# Patient Record
Sex: Male | Born: 1970 | Race: White | Hispanic: No | Marital: Single | Smoking: Current every day smoker
Health system: Southern US, Community
[De-identification: ages and names within clinical notes are randomized; demographics above are authoritative.]

## PROBLEM LIST (undated history)

## (undated) DIAGNOSIS — N289 Disorder of kidney and ureter, unspecified: Secondary | ICD-10-CM

## (undated) DIAGNOSIS — J449 Chronic obstructive pulmonary disease, unspecified: Secondary | ICD-10-CM

---

## 2012-11-11 LAB — URINALYSIS, COMPLETE
Bacteria: NONE SEEN
Glucose,UR: NEGATIVE mg/dL (ref 0–75)
Ketone: NEGATIVE
Protein: NEGATIVE
RBC,UR: 2 /HPF (ref 0–5)
Specific Gravity: 1.008 (ref 1.003–1.030)
Squamous Epithelial: <1
WBC UR: 2 /HPF (ref 0–5)

## 2012-11-11 LAB — COMPREHENSIVE METABOLIC PANEL
Anion Gap: 12 (ref 7–16)
BUN: 7 mg/dL (ref 7–18)
Bilirubin,Total: 0.3 mg/dL (ref 0.2–1.0)
Chloride: 105 mmol/L (ref 98–107)
Co2: 24 mmol/L (ref 21–32)
Creatinine: 0.64 mg/dL (ref 0.60–1.30)
EGFR (African American): >60
Potassium: 3.3 mmol/L — ABNORMAL LOW (ref 3.5–5.1)
SGOT(AST): 26 U/L (ref 15–37)
SGPT (ALT): 27 U/L (ref 12–78)
Sodium: 141 mmol/L (ref 136–145)
Total Protein: 7.4 g/dL (ref 6.4–8.2)

## 2012-11-11 LAB — SALICYLATE LEVEL: Salicylates, Serum: 2.7 mg/dL

## 2012-11-11 LAB — CBC
HGB: 16.2 g/dL (ref 13.0–18.0)
Platelet: 323 10*3/uL (ref 150–440)
RBC: 5.21 10*6/uL (ref 4.40–5.90)

## 2012-11-11 LAB — ACETAMINOPHEN LEVEL: Acetaminophen: <2 ug/mL

## 2012-11-11 LAB — ETHANOL
Ethanol %: 0.268 % — ABNORMAL HIGH (ref 0.000–0.080)
Ethanol: 268 mg/dL

## 2012-11-11 LAB — TSH: Thyroid Stimulating Horm: 0.7 u[IU]/mL

## 2012-11-11 LAB — DRUG SCREEN, URINE
Amphetamines, Ur Screen: NEGATIVE (ref ?–1000)
Barbiturates, Ur Screen: NEGATIVE (ref ?–200)
Benzodiazepine, Ur Scrn: NEGATIVE (ref ?–200)
Methadone, Ur Screen: NEGATIVE (ref ?–300)
Phencyclidine (PCP) Ur S: NEGATIVE (ref ?–25)
Tricyclic, Ur Screen: NEGATIVE (ref ?–1000)

## 2012-11-12 ENCOUNTER — Inpatient Hospital Stay: Payer: Self-pay | Admitting: Psychiatry

## 2015-02-15 NOTE — H&P (Signed)
PATIENT NAME:  Lawrence Hanna, Taggart MR#:  [id number] DATE OF BIRTH:  January 16, 1971  DATE OF ADMISSION:  11/12/2012  PLACE OF DICTATION:  [facility], [place].   SEX:  Male.  RACE:  White.  AGE:  44 years.  INITIAL ASSESSMENT:  Psychiatric evaluation.   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  The patient is never married, single and has been living with a roommate in an apartment.  The patient comes for inpatient hospitalization  at [facility]with  CHIEF COMPLAINT:  "Being very depressed, having suicidal thoughts and drinking too much alcohol and need help."  HISTORY OF PRESENT ILLNESS:  When patient was asked when he last felt well he reported "quite some time ago."  The patient started feeling depressed because he is overburdened with lots of stressors of life and this includes no family and nobody could care, "do not feel I am wanted by anybody." Started drinking alcohol at a rate of 5 or 6 beers per day and the last drink was just prior to his admission, decided that he needed help.  Started having suicidal thoughts and so he decided he should get help.    PAST PSYCHIATRIC HISTORY:  This is his third inpatient hospitalization to psychiatry. .  First inpatient of psychiatry was around 2000 at [facility] for 7 days for depression.  Longest period of inpatient with psychiatry was for 7 days.  History of suicide attempt twice, each time he slashed his wrists.  Not being followed by any psychiatrist at this time.  Family history of mental illness None known for MI..  No known suicides in the family.    FAMILY HISTORY:  .  Father is a Naval architecttruck driver.  Father is living and 45 years old.  Mother worked in a factory.  Mother is living and 45 years old.  Has 1 brother, not close to family.    PERSONAL HISTORY:  [place]  Dropped out of school because of being with the wrong crowd because he started doing drugs, smoking marijuana.  Got GED later.  No college.   WORK HISTORY:  First job was at age 44 years at [employer] and he did odd jobs for [employer].  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  This job lasted for 4 years and quit because company went out of business.  Last worked in October 2013 and job was only seasonal.    MILITARY HISTORY:  None.  MARRIAGES:  Never married.  Has 2 children, they are 45 year old twins, not in touch with them.  Used to pay child support, but not anymore.    HABITS:  First drink of alcohol was at age 44 years.  It became a problem recently when he started drinking at the rate of 5 beers per day or 6 beers per day because of feeling depressed and feeling hopeless and helpless.  Did try marijuana and crack cocaine but not anymore.  Denies IV drugs.  Does admit smoking nicotine cigarettes at a rate of 1-1/2 packs a day for many years.    PAST MEDICAL HISTORY:  No high blood pressure.  No diabetes mellitus.  No major surgeries.  No major injuries.  No history of motor vehicle accident  and never been unconscious.  He has kidney stones and being followed by no doctors at this time.  Was treated at [facility] 2 weeks ago for kidney stones, but no follow because he has no insurance.   ALLERGIES:  No known drug allergies.   PRIMARY CARE PHYSICIAN:  Not being followed by any physician as he cannot afford it.   PHYSICAL EXAMINATION:  VITAL SIGNS:  Temperature 97.6, pulse is 70 per minute regular, respiratory rate is 18 per minute, blood pressure is 118/76 mmHg.  HEENT:  Head is normocephalic, atraumatic.  Eyes PERRLA.  Fundi bilaterally benign.  Tympanic membranes visualized, no exudates.  NECK:  Supple without any organomegaly, lymphadenopathy.  CHEST:  Normal expansion.   Normal breath sounds. HEART:  Normal S1 without any murmurs or gallops. ABDOMEN:  Soft.  No organomegaly.  Bowel sounds heard.  RECTAL:  Deferred.  NEUROLOGIC:  Gait is normal.  Romberg is Negative..  Cranial nerves II-XII grossly intact.  DTRs 2+.  and normal..  Plantars are normal.    MENTAL STATUS EXAMINATION:  The patient is dressed in street clothes, alert and oriented to place, person, time.  and date and resson for admission to [facility].  Affect is appropriate.  with  mood which is lower, down and depressed, admits feeling low and down and depressed about his situation, life in general, finances and not having a family and not having anybody that cares for him.  Admits feeling hopeless, and helpless. and feels  worthless and useless..  Does have suicidal wishes.  Has no plans to hurt himself and wants to get help for the same.  No psychosis.  Denies auditory or visual hallucinations, paranoid thinking.  Cognition is intact.  He knew, name of the current president and previous president.  He could spell the word world forward and backward without any problem.  Memory and recall are good.  He could count money.  He reports that he has wishes of death, but contracts for safety and is not going to do anything to hurt himself at this time.  Insight and judgment are guarded.   IMPRESSION:  AXIS I:  Major depressive disorder, recurrent with the suicidal ideas, nicotine dependence, alcohol abuse/dependence, history of THC and cocaine abuse, remote, in remission.  AXIS II:  Deferred.  AXIS III:  Kidney stones and was treated for the same. AXIS IV:  Severe, loneliness, financial, no family support.  AXIS V:  Global assessment of functioning 25.   PLAN:  The patient will be admitted to [facility] for a closer observation eval and help.Marland Kitchen  He will be started on antidepressant medication to help him with his depression and cope with his depression.  During the stay in the hospital he will be given  mileau therapy.  He will take part in individual, group therapy where coping skills and dealing with loneliness will be addressed.  At the time of discharge the patient will not be depressed and have enough coping skills and appropriate followup appointment will be made in the community.     ____________________________ Jannet Mantis. Guss Bunde, MD skc:ea D: 11/12/2012 18:35:47 ET T: 11/13/2012 06:22:47 ET JOB#: [id number]  cc: Monika Salk K. Guss Bunde, MD, <Dictator> Beau Fanny MD ELECTRONICALLY SIGNED 11/13/2012 17:37

## 2015-09-09 ENCOUNTER — Emergency Department (HOSPITAL_COMMUNITY)
Admission: EM | Admit: 2015-09-09 | Discharge: 2015-09-09 | Disposition: A | Discharge status: Home / Self Care | Payer: Medicaid Other | Attending: Emergency Medicine | Admitting: Emergency Medicine

## 2015-09-09 ENCOUNTER — Encounter (HOSPITAL_COMMUNITY): Payer: Self-pay | Admitting: Emergency Medicine

## 2015-09-09 DIAGNOSIS — M25422 Effusion, left elbow: Secondary | ICD-10-CM | POA: Diagnosis present

## 2015-09-09 DIAGNOSIS — M7022 Olecranon bursitis, left elbow: Secondary | ICD-10-CM | POA: Insufficient documentation

## 2015-09-09 DIAGNOSIS — Y9389 Activity, other specified: Secondary | ICD-10-CM | POA: Diagnosis not present

## 2015-09-09 DIAGNOSIS — Z87448 Personal history of other diseases of urinary system: Secondary | ICD-10-CM | POA: Diagnosis not present

## 2015-09-09 DIAGNOSIS — F17210 Nicotine dependence, cigarettes, uncomplicated: Secondary | ICD-10-CM | POA: Diagnosis not present

## 2015-09-09 HISTORY — DX: Disorder of kidney and ureter, unspecified: N28.9

## 2015-09-09 MED ORDER — NAPROXEN 500 MG PO TABS: 500.0000 mg | ORAL_TABLET | Freq: Two times a day (BID) | ORAL | Status: DC

## 2015-09-09 MED ORDER — HYDROCODONE-ACETAMINOPHEN 5-325 MG PO TABS
2.0000 | ORAL_TABLET | Freq: Once | ORAL | Status: AC
Start: 2015-09-09 — End: 2015-09-09
  Administered 2015-09-09: 2 via ORAL
  Filled 2015-09-09: qty 2

## 2015-09-09 NOTE — ED Notes (Signed)
Pt c/o right elbow swelling x 2 weeks with pain

## 2015-09-09 NOTE — ED Provider Notes (Signed)
CSN: [id number]     Arrival date & time 09/09/15  1458 History   First MD Initiated Contact with Patient 09/09/15 1626     Chief Complaint  Patient presents with  . Joint Swelling    elbow     (Consider location/radiation/quality/duration/timing/severity/associated sxs/prior Treatment) The history is provided by the patient and medical records. No language interpreter was used.   Lawrence Hanna is a 44 y.o. male  with a PMH of renal disorder who presents to the Emergency Department complaining of worsening left elbow pain x 2 weeks which he describes as a burning. 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 He first noticed the swelling ~ 1.5 days later. Associated erythema. Tried Advil with no relief of pain. Has had similar episode on right elbow in the past. Movement makes pain worse, no alleviating factors noted. 6/10 at current. 8/10 at worst.    Past Medical History  Diagnosis Date  . Renal disorder    History reviewed. No pertinent past surgical history. No family history on file. Social History  Substance Use Topics  . Smoking status: Current Every Day Smoker    Types: Cigarettes  . Smokeless tobacco: None  . Alcohol Use: No    Review of Systems  Constitutional: Negative.   HENT: Negative for congestion, rhinorrhea and sore throat.   Eyes: Negative for visual disturbance.  Respiratory: Negative for cough, shortness of breath and wheezing.   Cardiovascular: Negative.   Gastrointestinal: Negative for nausea, vomiting, abdominal pain, diarrhea and constipation.  Endocrine: Negative for polydipsia and polyuria.  Musculoskeletal: Positive for joint swelling and arthralgias. Negative for back pain.  Skin: Negative for rash.  Neurological: Negative for dizziness, weakness and headaches.      Allergies  Codeine  Home Medications   Prior to Admission medications   Medication Sig Start Date End Date Taking?  Authorizing Provider  ibuprofen (ADVIL,MOTRIN) 200 MG tablet Take 200-400 mg by mouth every 6 (six) hours as needed for moderate pain.   Yes Historical Provider, MD  naproxen (NAPROSYN) 500 MG tablet Take 1 tablet (500 mg total) by mouth 2 (two) times daily. 09/09/15   Derral Colucci Pilcher Adolfo Granieri, PA-C   BP 152/84 mmHg  Pulse 93  Temp(Src) 98.8 F (37.1 C) (Oral)  Resp 17  Ht  (1.626 m)  Wt 136 lb (61.689 kg)  BMI 23.33 kg/m2  SpO2 100% Physical Exam  Constitutional: He is oriented to person, place, and time. He appears well-developed and well-nourished.  Alert and in no acute distress  HENT:  Head: Normocephalic and atraumatic.  Cardiovascular: Normal rate, regular rhythm, normal heart sounds and intact distal pulses.  Exam reveals no gallop and no friction rub.   No murmur heard. Pulmonary/Chest: Effort normal and breath sounds normal. No respiratory distress. He has no wheezes. He has no rales. He exhibits no tenderness.  Abdominal: He exhibits no mass. There is no rebound and no guarding.  Abdomen soft, non-tender, non-distended Bowel sounds positive in all four quadrants  Musculoskeletal:       Arms: Neurological: He is alert and oriented to person, place, and time.  Skin: Skin is warm and dry. No rash noted.  Psychiatric: He has a normal mood and affect. His behavior is normal. Judgment and thought content normal.  Nursing note and vitals reviewed.   ED Course  Procedures (including critical care time) Labs Review Labs Reviewed - No data to  display  Imaging Review No results found. I have personally reviewed and evaluated these images and lab results as part of my medical decision-making.   EKG Interpretation None      MDM   Final diagnoses:  Olecranon bursitis of left elbow   Leane ParaSteven Ishii presents with left elbow swelling and pain consistent with olecranon bursitis. No fever, no abrasion or open wounds to the area. No streaking - cellulitis unlikely.  Will  discharge home with elbow brace and naproxen, with home care instructions including ice. Will give ortho follow up as well. Patient given instructions on when to return to ER.    [facility] Pilcher Kasie Leccese, PA-C 09/09/15 1716  Pricilla LovelessScott Goldston, MD 09/11/15 [phone]

## 2015-09-09 NOTE — Progress Notes (Signed)
Patient listed as not having a pcp or insurance.  EDCM spoke to patient at bedside.  [place]  EDCM provided patient with list of free clinics [place] such as the [place] [facility].  Also provided patient with contact information for DSS in [place] and provided list of financial resources in [place].  [facility] asked patient if he has enrolled for Medicaid or the Affordable Care Act for insurance?  Patient reports he has an appointment to enroll for the Affordable Care Act on Tuesday.  No further EDCM needs at this time.

## 2015-09-09 NOTE — Discharge Instructions (Signed)
1. Medications: Take naproxen as directed for inflammation and pain, continue usual home medications 2. Treatment: Ice the affected area for 20 minutes at a time, at least 3 times per day. Keep elbow in brace to decrease swelling, and elevate arm whenever possible  3. Follow Up: Please follow up with the orthopedic doctor listed above in 3 days if your symptoms are not improving for discussion of your diagnoses and further evaluation after today's visit; Please return to the ER for any new or worsening symptoms, or any additional concerns.    Elbow Bursitis A bursa is a fluid-filled sac that covers and protects a joint. Bursitis is when the fluid-filled sac gets puffy and sore (inflamed). Elbow bursitis, also called olecranon bursitis, happens over your elbow. This may be caused by:  Injury (acute trauma) to your elbow.   Leaning on hard surfaces for long periods of time.   Infection from an injury that breaks the skin near your elbow.  A bone growth (spur) that forms at the tip of your elbow.   A medical condition that causes inflammation in your body, such as:  Gout.  Rheumatoid arthritis. Sometimes the cause is not known. HOME CARE  Take medicines only as told by your doctor.  If you were prescribed an antibiotic medicine, finish all of it even if you start to feel better.   If your bursitis is caused by an injury, rest your elbow and wear your bandage as told by your doctor. You may also apply ice to the injured area as told by your doctor:   Put ice in a plastic bag.   Place a towel between your skin and the bag.   Leave the ice on for 20 minutes, 2-3 times per day.   Do not do any activities that cause pain to your elbow.  Use elbow pads or wraps to cushion your elbow.  GET HELP IF:  You have a fever.   Your symptoms do not get better with treatment.   Your pain or swelling gets worse.   Your pain or swelling goes away and then comes back.  You  have drainage of pus from the swollen area over your elbow.   This information is not intended to replace advice given to you by your health care provider. Make sure you discuss any questions you have with your health care provider.   Document Released: 04/01/2010 Document Revised: 07/03/2015 Document Reviewed: 06/20/2014 Elsevier Interactive Patient Education [employer] Elsevier Inc.

## 2015-09-09 NOTE — ED Notes (Signed)
PA at bedside.

## 2017-03-21 ENCOUNTER — Encounter (HOSPITAL_COMMUNITY): Payer: Self-pay | Admitting: Emergency Medicine

## 2017-03-21 ENCOUNTER — Inpatient Hospital Stay (HOSPITAL_COMMUNITY)
Admission: EM | Admit: 2017-03-21 | Discharge: 2017-03-25 | DRG: 177 | Disposition: A | Discharge status: Home / Self Care | Payer: Medicaid Other | Attending: Internal Medicine | Admitting: Internal Medicine

## 2017-03-21 ENCOUNTER — Emergency Department (HOSPITAL_COMMUNITY): Payer: Medicaid Other

## 2017-03-21 DIAGNOSIS — R0602 Shortness of breath: Secondary | ICD-10-CM

## 2017-03-21 DIAGNOSIS — Z885 Allergy status to narcotic agent status: Secondary | ICD-10-CM

## 2017-03-21 DIAGNOSIS — R410 Disorientation, unspecified: Secondary | ICD-10-CM

## 2017-03-21 DIAGNOSIS — J9602 Acute respiratory failure with hypercapnia: Secondary | ICD-10-CM | POA: Diagnosis present

## 2017-03-21 DIAGNOSIS — N289 Disorder of kidney and ureter, unspecified: Secondary | ICD-10-CM | POA: Diagnosis present

## 2017-03-21 DIAGNOSIS — J690 Pneumonitis due to inhalation of food and vomit: Secondary | ICD-10-CM

## 2017-03-21 DIAGNOSIS — Z7982 Long term (current) use of aspirin: Secondary | ICD-10-CM

## 2017-03-21 DIAGNOSIS — F1920 Other psychoactive substance dependence, uncomplicated: Secondary | ICD-10-CM | POA: Diagnosis present

## 2017-03-21 DIAGNOSIS — J9601 Acute respiratory failure with hypoxia: Secondary | ICD-10-CM | POA: Diagnosis present

## 2017-03-21 DIAGNOSIS — F1410 Cocaine abuse, uncomplicated: Secondary | ICD-10-CM | POA: Diagnosis present

## 2017-03-21 DIAGNOSIS — F17210 Nicotine dependence, cigarettes, uncomplicated: Secondary | ICD-10-CM | POA: Diagnosis present

## 2017-03-21 DIAGNOSIS — R45851 Suicidal ideations: Secondary | ICD-10-CM | POA: Diagnosis present

## 2017-03-21 DIAGNOSIS — R739 Hyperglycemia, unspecified: Secondary | ICD-10-CM | POA: Diagnosis present

## 2017-03-21 DIAGNOSIS — I471 Supraventricular tachycardia: Secondary | ICD-10-CM

## 2017-03-21 DIAGNOSIS — J441 Chronic obstructive pulmonary disease with (acute) exacerbation: Secondary | ICD-10-CM | POA: Diagnosis present

## 2017-03-21 DIAGNOSIS — J189 Pneumonia, unspecified organism: Secondary | ICD-10-CM | POA: Diagnosis present

## 2017-03-21 DIAGNOSIS — J42 Unspecified chronic bronchitis: Secondary | ICD-10-CM

## 2017-03-21 DIAGNOSIS — E876 Hypokalemia: Secondary | ICD-10-CM | POA: Diagnosis present

## 2017-03-21 DIAGNOSIS — F1123 Opioid dependence with withdrawal: Secondary | ICD-10-CM | POA: Diagnosis present

## 2017-03-21 DIAGNOSIS — F1994 Other psychoactive substance use, unspecified with psychoactive substance-induced mood disorder: Secondary | ICD-10-CM | POA: Diagnosis present

## 2017-03-21 DIAGNOSIS — R0902 Hypoxemia: Secondary | ICD-10-CM

## 2017-03-21 HISTORY — DX: Chronic obstructive pulmonary disease, unspecified: J44.9

## 2017-03-21 LAB — COMPREHENSIVE METABOLIC PANEL
ALBUMIN: 3.1 g/dL — AB (ref 3.5–5.0)
ALT: 11 U/L — AB (ref 17–63)
ANION GAP: 7 (ref 5–15)
AST: 21 U/L (ref 15–41)
Alkaline Phosphatase: 64 U/L (ref 38–126)
BUN: 9 mg/dL (ref 6–20)
CALCIUM: 8.5 mg/dL — AB (ref 8.9–10.3)
CO2: 25 mmol/L (ref 22–32)
Chloride: 104 mmol/L (ref 101–111)
Creatinine, Ser: 0.87 mg/dL (ref 0.61–1.24)
GFR calc Af Amer: >60 mL/min (ref >60)
GFR calc non Af Amer: >60 mL/min (ref >60)
GLUCOSE: 157 mg/dL — AB (ref 65–99)
Potassium: 3.4 mmol/L — ABNORMAL LOW (ref 3.5–5.1)
SODIUM: 136 mmol/L (ref 135–145)
Total Bilirubin: 0.7 mg/dL (ref 0.3–1.2)
Total Protein: 6.4 g/dL — ABNORMAL LOW (ref 6.5–8.1)

## 2017-03-21 LAB — ETHANOL: Alcohol, Ethyl (B): <5 mg/dL (ref <5)

## 2017-03-21 LAB — CBC WITH DIFFERENTIAL/PLATELET
BASOS PCT: 0 %
Basophils Absolute: 0 10*3/uL (ref 0.0–0.1)
EOS ABS: 0 10*3/uL (ref 0.0–0.7)
Eosinophils Relative: 0 %
HCT: 39.6 % (ref 39.0–52.0)
Hemoglobin: 13 g/dL (ref 13.0–17.0)
Lymphocytes Relative: 6 %
Lymphs Abs: 0.6 10*3/uL — ABNORMAL LOW (ref 0.7–4.0)
MCH: 29.6 pg (ref 26.0–34.0)
MCHC: 32.8 g/dL (ref 30.0–36.0)
MCV: 90.2 fL (ref 78.0–100.0)
Monocytes Absolute: 0.2 10*3/uL (ref 0.1–1.0)
Monocytes Relative: 2 %
NEUTROS PCT: 92 %
Neutro Abs: 9.1 10*3/uL — ABNORMAL HIGH (ref 1.7–7.7)
Platelets: 276 10*3/uL (ref 150–400)
RBC: 4.39 MIL/uL (ref 4.22–5.81)
RDW: 13.8 % (ref 11.5–15.5)
WBC: 9.9 10*3/uL (ref 4.0–10.5)

## 2017-03-21 LAB — TROPONIN I: Troponin I: <0.03 ng/mL (ref <0.03)

## 2017-03-21 LAB — RAPID URINE DRUG SCREEN, HOSP PERFORMED
AMPHETAMINES: NOT DETECTED
BARBITURATES: NOT DETECTED
Benzodiazepines: NOT DETECTED
COCAINE: POSITIVE — AB
Opiates: POSITIVE — AB
TETRAHYDROCANNABINOL: NOT DETECTED

## 2017-03-21 LAB — D-DIMER, QUANTITATIVE: D-Dimer, Quant: 1.83 ug/mL-FEU — ABNORMAL HIGH (ref 0.00–0.50)

## 2017-03-21 MED ORDER — CLONIDINE HCL 0.1 MG PO TABS
0.1000 mg | ORAL_TABLET | Freq: Once | ORAL | Status: AC
  Administered 2017-03-21: 0.1 mg via ORAL
  Filled 2017-03-21: qty 1

## 2017-03-21 MED ORDER — ONDANSETRON 4 MG PO TBDP: 4.0000 mg | ORAL_TABLET | Freq: Four times a day (QID) | ORAL | Status: DC | PRN

## 2017-03-21 MED ORDER — TRAZODONE HCL 50 MG PO TABS
50.0000 mg | ORAL_TABLET | Freq: Every day | ORAL | Status: DC
  Administered 2017-03-21 – 2017-03-22 (×3): 50 mg via ORAL
  Filled 2017-03-21 (×3): qty 1

## 2017-03-21 MED ORDER — METHOCARBAMOL 500 MG PO TABS
500.0000 mg | ORAL_TABLET | Freq: Three times a day (TID) | ORAL | Status: DC | PRN
  Administered 2017-03-21 – 2017-03-22 (×3): 500 mg via ORAL
  Filled 2017-03-21 (×3): qty 1

## 2017-03-21 MED ORDER — OXYCODONE-ACETAMINOPHEN 5-325 MG PO TABS
1.0000 | ORAL_TABLET | Freq: Once | ORAL | Status: AC
  Administered 2017-03-21: 1 via ORAL
  Filled 2017-03-21: qty 1

## 2017-03-21 MED ORDER — HYDROXYZINE HCL 25 MG PO TABS
25.0000 mg | ORAL_TABLET | Freq: Four times a day (QID) | ORAL | Status: DC | PRN
  Administered 2017-03-21 – 2017-03-22 (×2): 25 mg via ORAL
  Filled 2017-03-21 (×2): qty 1

## 2017-03-21 MED ORDER — CLONIDINE HCL 0.1 MG PO TABS
0.1000 mg | ORAL_TABLET | Freq: Four times a day (QID) | ORAL | Status: AC
  Administered 2017-03-21 – 2017-03-22 (×7): 0.1 mg via ORAL
  Filled 2017-03-21 (×7): qty 1

## 2017-03-21 MED ORDER — CLONIDINE HCL 0.1 MG PO TABS: 0.1000 mg | ORAL_TABLET | Freq: Every day | ORAL | Status: DC

## 2017-03-21 MED ORDER — NAPROXEN 500 MG PO TABS
500.0000 mg | ORAL_TABLET | Freq: Two times a day (BID) | ORAL | Status: DC | PRN
  Administered 2017-03-21: 500 mg via ORAL
  Filled 2017-03-21: qty 1

## 2017-03-21 MED ORDER — DICYCLOMINE HCL 20 MG PO TABS
20.0000 mg | ORAL_TABLET | Freq: Four times a day (QID) | ORAL | Status: DC | PRN
  Administered 2017-03-21: 20 mg via ORAL
  Filled 2017-03-21 (×2): qty 1

## 2017-03-21 MED ORDER — IOPAMIDOL (ISOVUE-370) INJECTION 76%
INTRAVENOUS | Status: AC
  Filled 2017-03-21: qty 100

## 2017-03-21 MED ORDER — CLONIDINE HCL 0.1 MG PO TABS
0.1000 mg | ORAL_TABLET | ORAL | Status: DC
Start: 2017-03-23 — End: 2017-03-25
  Administered 2017-03-23 – 2017-03-24 (×3): 0.1 mg via ORAL
  Filled 2017-03-21 (×4): qty 1

## 2017-03-21 MED ORDER — IOPAMIDOL (ISOVUE-370) INJECTION 76%
100.0000 mL | Freq: Once | INTRAVENOUS | Status: AC | PRN
  Administered 2017-03-21: 100 mL via INTRAVENOUS

## 2017-03-21 MED ORDER — LOPERAMIDE HCL 2 MG PO CAPS: 2.0000 mg | ORAL_CAPSULE | ORAL | Status: DC | PRN

## 2017-03-21 MED ORDER — ONDANSETRON 4 MG PO TBDP
4.0000 mg | ORAL_TABLET | Freq: Once | ORAL | Status: AC
  Administered 2017-03-21: 4 mg via ORAL
  Filled 2017-03-21: qty 1

## 2017-03-21 MED ORDER — HYDROXYZINE HCL 50 MG/ML IM SOLN
100.0000 mg | Freq: Once | INTRAMUSCULAR | Status: AC
  Administered 2017-03-21: 100 mg via INTRAMUSCULAR
  Filled 2017-03-21: qty 2

## 2017-03-21 NOTE — ED Notes (Signed)
Patient is continuing to yell at staff and using profanity. This tech can hear patient and his obscenities in the room adjacent to him. This tech also went to a room across the hall and the patient and family members are complaining about the loud cursing coming out of this room. Charge RN and Off Duty notified of patients behavior and complaints.

## 2017-03-21 NOTE — Progress Notes (Addendum)
Patient refuse to take his BP.

## 2017-03-21 NOTE — BH Assessment (Signed)
Tele Assessment Note   Lawrence Hanna is an 46 y.o. male. Pt reports SI with no plan. Pt denies HI and AVH. Pt denies current or past outpatient/inpatient treatment. Pt reports daily heroin and crack cocaine use. Pt states he uses a 1/2 gram a day of heroin and $50 worth of crack cocaine a day. Pt denies alcohol use. Pt states he is seeking detox from heroin. Pt has been diagnosed with COPD and Renal disorder. Pt denies abuse.   Dr. Vanetta ShawlHisada recommends overnight observation and am psych evaluation.   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 COPD (chronic obstructive pulmonary disease) (HCC)   . Renal disorder     History reviewed. No pertinent surgical history.  Family History: History reviewed. No pertinent family history.  Social History:  reports that he has been smoking Cigarettes.  He has never used smokeless tobacco. He reports that he does not drink alcohol. His drug history is not on file.  Additional Social History:  Alcohol / Drug Use Pain Medications: please see mar Prescriptions: please see mar Over the Counter: please see mar History of alcohol / drug use?: Yes Longest period of sobriety (when/how long): unknown Negative Consequences of Use: Financial, Legal, Personal relationships, Work / School Withdrawal Symptoms: Fever / Chills, Irritability, Nausea / Vomiting, Diarrhea Substance #1 Name of Substance 1: heroin 1 - Age of First Use: unknown 1 - Amount (size/oz): half a gram 1 - Frequency: daily 1 - Duration: ongoing 1 - Last Use / Amount: 03/21/17 Substance #2 Name of Substance 2: crack cocaine 2 - Age of First Use: unknown 2 - Amount (size/oz): $50 2 - Frequency: daily 2 - Duration: ongoing 2 - Last Use / Amount: 03/21/17  CIWA: CIWA-Ar BP: 118/68 Pulse Rate: 99 COWS:    PATIENT STRENGTHS: (choose at least two) Average or above average intelligence Communication skills  Allergies:   Allergies  Allergen Reactions  . Codeine Nausea Only    Home Medications:  (Not in a hospital admission)  OB/GYN Status:  No LMP for male patient.  General Assessment Data Location of Assessment: [facility] TTS Assessment: In system Is this a Tele or Face-to-Face Assessment?: Face-to-Face Is this an Initial Assessment or a Re-assessment for this encounter?: Initial Assessment Marital status: Single Maiden name: NA Is patient pregnant?: No Pregnancy Status: No Living Arrangements: Alone Can pt return to current living arrangement?: Yes Admission Status: Voluntary Is patient capable of signing voluntary admission?: Yes Referral Source: Self/Family/Friend Insurance type: Medicaid     Crisis Care Plan Living Arrangements: Alone Legal Guardian: Other: (self) Name of Psychiatrist: NA Name of Therapist: NA  Education Status Is patient currently in school?: No Current Grade: NA Highest grade of school patient has completed: 10 Name of school: NA Contact person: NA  Risk to self with the past 6 months Suicidal Ideation: Yes-Currently Present Has patient been a risk to self within the past 6 months prior to admission? : No Suicidal Intent: No Has patient had any suicidal intent within the past 6 months prior to admission? : No Is patient at risk for suicide?: No Suicidal Plan?: No Has patient had any suicidal plan within the past 6 months prior to admission? : No Access to Means: No What has been your use of drugs/alcohol within the last 12 months?: heroin and crack cocaine Previous Attempts/Gestures: No How many times?: 0 Other Self Harm Risks: NA Triggers for Past  Attempts: None known Intentional Self Injurious Behavior: None Family Suicide History: No Recent stressful life event(s): Other (Comment) (SA) Persecutory voices/beliefs?: No Depression: Yes Depression Symptoms: Loss of interest in usual pleasures, Feeling worthless/self pity, Feeling angry/irritable  (SA) Substance abuse history and/or treatment for substance abuse?: No Suicide prevention information given to non-admitted patients: Not applicable  Risk to Others within the past 6 months Homicidal Ideation: No Does patient have any lifetime risk of violence toward others beyond the six months prior to admission? : No Thoughts of Harm to Others: No Current Homicidal Intent: No Current Homicidal Plan: No Access to Homicidal Means: No Identified Victim: NA History of harm to others?: No Assessment of Violence: None Noted Violent Behavior Description: NA Does patient have access to weapons?: No Criminal Charges Pending?: No Does patient have a court date: No Is patient on probation?: No  Psychosis Hallucinations: None noted Delusions: None noted  Mental Status Report Appearance/Hygiene: Disheveled Eye Contact: Fair Motor Activity: Freedom of movement Speech: Logical/coherent Level of Consciousness: Alert Mood: Depressed Affect: Depressed Anxiety Level: Minimal Thought Processes: Coherent, Relevant Judgement: Unimpaired Orientation: Person, Place, Situation, Time, Appropriate for developmental age Obsessive Compulsive Thoughts/Behaviors: None  Cognitive Functioning Concentration: Normal Memory: Recent Intact, Remote Intact IQ: Average Insight: Fair Impulse Control: Fair Appetite: Fair Weight Loss: 0 Weight Gain: 0 Sleep: Decreased Total Hours of Sleep: 5 Vegetative Symptoms: None  ADLScreening [facility] Assessment Services) Patient's cognitive ability adequate to safely complete daily activities?: Yes Patient able to express need for assistance with ADLs?: Yes Independently performs ADLs?: Yes (appropriate for developmental age)  Prior Inpatient Therapy Prior Inpatient Therapy: No Prior Therapy Dates: Na Prior Therapy Facilty/Provider(s): NA Reason for Treatment: NA  Prior Outpatient Therapy Prior Outpatient Therapy: No Prior Therapy Dates: NA Prior Therapy  Facilty/Provider(s): nA Reason for Treatment: NA Does patient have an ACCT team?: No Does patient have Intensive In-House Services?  : No Does patient have [facility] services? : No Does patient have P4CC services?: No  ADL Screening (condition at time of admission) Patient's cognitive ability adequate to safely complete daily activities?: Yes Is the patient deaf or have difficulty hearing?: No Does the patient have difficulty seeing, even when wearing glasses/contacts?: No Does the patient have difficulty concentrating, remembering, or making decisions?: No Patient able to express need for assistance with ADLs?: Yes Does the patient have difficulty dressing or bathing?: No Independently performs ADLs?: Yes (appropriate for developmental age) Does the patient have difficulty walking or climbing stairs?: No Weakness of Legs: None Weakness of Arms/Hands: None       Abuse/Neglect Assessment (Assessment to be complete while patient is alone) Physical Abuse: Denies Verbal Abuse: Denies Sexual Abuse: Denies Exploitation of patient/patient's resources: Denies Self-Neglect: Denies     Merchant navy officer (For Healthcare) Does Patient Have a Medical Advance Directive?: No Nutrition Screen- [facility] Adult/[facility] Patient's home diet: Regular  Additional Information 1:1 In Past 12 Months?: No CIRT Risk: No Elopement Risk: No Does patient have medical clearance?: Yes     Disposition:  Disposition Initial Assessment Completed for this Encounter: Yes Disposition of Patient: Other dispositions Other disposition(s): Other (Comment)  Lucienne Sawyers D 03/21/2017 12:36 PM

## 2017-03-21 NOTE — ED Triage Notes (Signed)
Patient in from home with complaints of SOB that started yesterday. Reports history of COPD. 91% RA. Cough. Also reports SI with no plan. Requesting detox from IV drug use.

## 2017-03-21 NOTE — ED Notes (Signed)
Patient came out into the hallway demanding pain medication. Patient stated. "Give me some God Damn pain medicine or I will hang myself." patient repeated several times and pulled the curtain shut. Writer opened the curtain and was informed that we could not give him pain medicine at thisi time. Patient stated, "I want something to make me sleep." patient repeatedly yelled out cursing and repeating that he was going to kill himself. Security walking by and heard patient yelling. Security at the bedside. Charge nurse made aware.

## 2017-03-21 NOTE — ED Notes (Signed)
Bed: WA24 Expected date:  Expected time:  Means of arrival:  Comments: EMS-SOB 

## 2017-03-21 NOTE — ED Notes (Signed)
Patient is still pacing around asking to walk the halls in just his underwear. Patient still cursing and stating that "we don't care how he feels". Patient has been reminded again that he has to stay in his room and cant be yelling obscenities.

## 2017-03-21 NOTE — ED Triage Notes (Signed)
Pt verbally loud and cursing, he has been warned several times to stop, Clinical research associatewriter has spoken with EDP Zammit who is aware of behavior. Pt has a choice to stay and remain quite or he can leave. Pt is not under IVC paperwork. Pt will calm a few min when given options. Due to multiple complaints from other pts/families final warning has been given that one more outburst and he will be removed from facility. [employer] aware.

## 2017-03-21 NOTE — ED Notes (Signed)
Patient in the room yelling. Frigidity. Stating "I am detoxing and I need drugs now".

## 2017-03-21 NOTE — ED Provider Notes (Addendum)
[facility]-EMERGENCY DEPT Provider Note   CSN: [id number] Arrival date & time: 03/21/17  [id number]     History   Chief Complaint Chief Complaint  Patient presents with  . Shortness of Breath  . Suicidal    HPI Lawrence Hanna is a 46 y.o. male.  Patient was complaining of chest discomfort. Patient states he's been using hair when. Patient states that he is going to kill himself.   The history is provided by the patient.  Shortness of Breath  This is a new problem. The problem occurs frequently.The current episode started 12 to 24 hours ago. The problem has been gradually improving. Pertinent negatives include no fever, no headaches, no cough, no chest pain, no abdominal pain and no rash. Risk factors: Substance abuse.    Past Medical History:  Diagnosis Date  . COPD (chronic obstructive pulmonary disease) (HCC)   . Renal disorder     There are no active problems to display for this patient.   History reviewed. No pertinent surgical history.     Home Medications    Prior to Admission medications   Medication Sig Start Date End Date Taking? Authorizing Provider  naproxen (NAPROSYN) 500 MG tablet Take 1 tablet (500 mg total) by mouth 2 (two) times daily. Patient not taking: Reported on 03/21/2017 09/09/15   Ward, Chase PicketJaime Pilcher, PA-C    Family History History reviewed. No pertinent family history.  Social History Social History  Substance Use Topics  . Smoking status: Current Every Day Smoker    Types: Cigarettes  . Smokeless tobacco: Never Used  . Alcohol use No     Allergies   Codeine   Review of Systems Review of Systems  Constitutional: Negative for appetite change, fatigue and fever.  HENT: Negative for congestion, ear discharge and sinus pressure.   Eyes: Negative for discharge.  Respiratory: Positive for shortness of breath. Negative for cough.   Cardiovascular: Negative for chest pain.  Gastrointestinal: Negative for abdominal pain and diarrhea.    Genitourinary: Negative for frequency and hematuria.  Musculoskeletal: Negative for back pain.  Skin: Negative for rash.  Neurological: Negative for seizures and headaches.  Psychiatric/Behavioral: Positive for suicidal ideas. Negative for hallucinations.     Physical Exam Updated Vital Signs BP 120/62 (BP Location: Right Arm)   Pulse 91   Temp 98.4 F (36.9 C) (Oral)   Resp (!) 31   SpO2 93%   Physical Exam  Constitutional: He is oriented to person, place, and time. He appears well-developed.  HENT:  Head: Normocephalic.  Eyes: Conjunctivae and EOM are normal. No scleral icterus.  Neck: Neck supple. No thyromegaly present.  Cardiovascular: Normal rate and regular rhythm.  Exam reveals no gallop and no friction rub.   No murmur heard. Pulmonary/Chest: No stridor. He has no wheezes. He has no rales. He exhibits no tenderness.  Abdominal: He exhibits no distension. There is no tenderness. There is no rebound.  Musculoskeletal: Normal range of motion. He exhibits no edema.  Lymphadenopathy:    He has no cervical adenopathy.  Neurological: He is oriented to person, place, and time. He exhibits normal muscle tone. Coordination normal.  Skin: No rash noted. No erythema.  Psychiatric: He has a normal mood and affect. His behavior is normal.     ED Treatments / Results  Labs (all labs ordered are listed, but only abnormal results are displayed) Labs Reviewed  CBC WITH DIFFERENTIAL/PLATELET - Abnormal; Notable for the following:       Result Value  Neutro Abs 9.1 (*)    Lymphs Abs 0.6 (*)    All other components within normal limits  COMPREHENSIVE METABOLIC PANEL - Abnormal; Notable for the following:    Potassium 3.4 (*)    Glucose, Bld 157 (*)    Calcium 8.5 (*)    Total Protein 6.4 (*)    Albumin 3.1 (*)    ALT 11 (*)    All other components within normal limits  D-DIMER, QUANTITATIVE (NOT AT [facility]) - Abnormal; Notable for the following:    D-Dimer, Quant 1.83 (*)     All other components within normal limits  ETHANOL  TROPONIN I  RAPID URINE DRUG SCREEN, HOSP PERFORMED    EKG  EKG Interpretation None       Radiology Dg Chest 2 View  Result Date: 03/21/2017 CLINICAL DATA:  Shortness of breath, cough, congestion EXAM: CHEST  2 VIEW COMPARISON:  None. FINDINGS: Lungs are clear.  No pleural effusion or pneumothorax. The heart is normal in size. Visualized osseous structures are within normal limits. IMPRESSION: Normal chest radiograph. Electronically Signed   By: Charline Bills M.D.   On: 03/21/2017 08:52   Ct Angio Chest Pe W Or Wo Contrast  Result Date: 03/21/2017 CLINICAL DATA:  Shortness of breath, cough EXAM: CT ANGIOGRAPHY CHEST WITH CONTRAST TECHNIQUE: Multidetector CT imaging of the chest was performed using the standard protocol during bolus administration of intravenous contrast. Multiplanar CT image reconstructions and MIPs were obtained to evaluate the vascular anatomy. CONTRAST:  100 mL Isovue 370 IV COMPARISON:  Chest radiographs dated 03/21/2017. FINDINGS: Cardiovascular: Satisfactory opacification the bilateral pulmonary artery to the segmental level. Evaluation is mildly constrained due to respiratory motion. No evidence pulmonary embolism. The heart is normal in size.  No pericardial effusion. No evidence of thoracic aortic aneurysm. Mediastinum/Nodes: No suspicious mediastinal lymphadenopathy. Visualized thyroid is unremarkable. Lungs/Pleura: Evaluation of the lung parenchyma is constrained by respiratory motion. Mild centrilobular and paraseptal emphysematous changes, upper lobe predominant. Very mild patchy opacity in the medial right middle lobe (series 7/ image 66), likely infectious. Minimal left basilar opacity, likely atelectasis. No suspicious pulmonary nodules. No pleural effusion or pneumothorax. Upper Abdomen: Visualized upper abdomen is unremarkable. Musculoskeletal: Visualized osseous structures are within normal limits.  Review of the MIP images confirms the above findings. IMPRESSION: No evidence pulmonary embolism. Very mild patchy opacity in the medial right middle lobe, likely reflecting mild infection. Electronically Signed   By: Charline Bills M.D.   On: 03/21/2017 10:45    Procedures Procedures (including critical care time)  Medications Ordered in ED Medications  iopamidol (ISOVUE-370) 76 % injection (not administered)  oxyCODONE-acetaminophen (PERCOCET/ROXICET) 5-325 MG per tablet 1 tablet (1 tablet Oral Given 03/21/17 0917)  iopamidol (ISOVUE-370) 76 % injection 100 mL (100 mLs Intravenous Contrast Given 03/21/17 1030)     Initial Impression / Assessment and Plan / ED Course  I have reviewed the triage vital signs and the nursing notes.  Pertinent labs & imaging results that were available during my care of the patient were reviewed by me and considered in my medical decision making (see chart for details).     Patient with mild COPD exacerbation And hair when with withdrawal symptoms. He is medically cleared. Patient has substance abuse. He will be seen by psychiatry for suicidal ideations  Final Clinical Impressions(s) / ED Diagnoses   Final diagnoses:  SOB (shortness of breath)    New Prescriptions New Prescriptions   No medications on file  Bethann Berkshire, MD 03/21/17 1149    Bethann Berkshire, MD 03/21/17 [phone]

## 2017-03-21 NOTE — ED Notes (Signed)
Patient has been asked to scrubbed out. Patient asking to put on scrubs in a little bit. Patient states that he is hot. Patient is in just his underwear. Patients clothing and cell phone are in patient belongings are in a bag at the nursing station by room 23.

## 2017-03-22 ENCOUNTER — Encounter (HOSPITAL_COMMUNITY): Payer: Self-pay | Admitting: Internal Medicine

## 2017-03-22 DIAGNOSIS — J189 Pneumonia, unspecified organism: Secondary | ICD-10-CM | POA: Diagnosis present

## 2017-03-22 DIAGNOSIS — Z886 Allergy status to analgesic agent status: Secondary | ICD-10-CM | POA: Diagnosis not present

## 2017-03-22 DIAGNOSIS — J439 Emphysema, unspecified: Secondary | ICD-10-CM | POA: Diagnosis not present

## 2017-03-22 DIAGNOSIS — R739 Hyperglycemia, unspecified: Secondary | ICD-10-CM | POA: Diagnosis present

## 2017-03-22 DIAGNOSIS — J441 Chronic obstructive pulmonary disease with (acute) exacerbation: Secondary | ICD-10-CM | POA: Diagnosis present

## 2017-03-22 DIAGNOSIS — J9602 Acute respiratory failure with hypercapnia: Secondary | ICD-10-CM | POA: Diagnosis present

## 2017-03-22 DIAGNOSIS — F419 Anxiety disorder, unspecified: Secondary | ICD-10-CM | POA: Diagnosis not present

## 2017-03-22 DIAGNOSIS — Z885 Allergy status to narcotic agent status: Secondary | ICD-10-CM | POA: Diagnosis not present

## 2017-03-22 DIAGNOSIS — J181 Lobar pneumonia, unspecified organism: Secondary | ICD-10-CM | POA: Diagnosis not present

## 2017-03-22 DIAGNOSIS — E876 Hypokalemia: Secondary | ICD-10-CM | POA: Diagnosis present

## 2017-03-22 DIAGNOSIS — Z814 Family history of other substance abuse and dependence: Secondary | ICD-10-CM | POA: Diagnosis not present

## 2017-03-22 DIAGNOSIS — F1410 Cocaine abuse, uncomplicated: Secondary | ICD-10-CM | POA: Diagnosis present

## 2017-03-22 DIAGNOSIS — R45851 Suicidal ideations: Secondary | ICD-10-CM | POA: Diagnosis present

## 2017-03-22 DIAGNOSIS — I509 Heart failure, unspecified: Secondary | ICD-10-CM | POA: Diagnosis not present

## 2017-03-22 DIAGNOSIS — R0602 Shortness of breath: Secondary | ICD-10-CM | POA: Diagnosis present

## 2017-03-22 DIAGNOSIS — N289 Disorder of kidney and ureter, unspecified: Secondary | ICD-10-CM | POA: Diagnosis present

## 2017-03-22 DIAGNOSIS — F17210 Nicotine dependence, cigarettes, uncomplicated: Secondary | ICD-10-CM | POA: Diagnosis present

## 2017-03-22 DIAGNOSIS — Z79899 Other long term (current) drug therapy: Secondary | ICD-10-CM | POA: Diagnosis not present

## 2017-03-22 DIAGNOSIS — Z818 Family history of other mental and behavioral disorders: Secondary | ICD-10-CM | POA: Diagnosis not present

## 2017-03-22 DIAGNOSIS — F1994 Other psychoactive substance use, unspecified with psychoactive substance-induced mood disorder: Secondary | ICD-10-CM | POA: Diagnosis not present

## 2017-03-22 DIAGNOSIS — F1123 Opioid dependence with withdrawal: Secondary | ICD-10-CM | POA: Diagnosis present

## 2017-03-22 DIAGNOSIS — I471 Supraventricular tachycardia: Secondary | ICD-10-CM | POA: Diagnosis present

## 2017-03-22 DIAGNOSIS — J449 Chronic obstructive pulmonary disease, unspecified: Secondary | ICD-10-CM | POA: Insufficient documentation

## 2017-03-22 DIAGNOSIS — Z7982 Long term (current) use of aspirin: Secondary | ICD-10-CM | POA: Diagnosis not present

## 2017-03-22 DIAGNOSIS — J9601 Acute respiratory failure with hypoxia: Secondary | ICD-10-CM | POA: Diagnosis present

## 2017-03-22 DIAGNOSIS — F1924 Other psychoactive substance dependence with psychoactive substance-induced mood disorder: Secondary | ICD-10-CM | POA: Diagnosis not present

## 2017-03-22 DIAGNOSIS — F329 Major depressive disorder, single episode, unspecified: Secondary | ICD-10-CM | POA: Diagnosis not present

## 2017-03-22 DIAGNOSIS — J690 Pneumonitis due to inhalation of food and vomit: Secondary | ICD-10-CM | POA: Diagnosis present

## 2017-03-22 DIAGNOSIS — Z7951 Long term (current) use of inhaled steroids: Secondary | ICD-10-CM | POA: Diagnosis not present

## 2017-03-22 DIAGNOSIS — R410 Disorientation, unspecified: Secondary | ICD-10-CM | POA: Diagnosis not present

## 2017-03-22 LAB — CBC
HCT: 40.2 % (ref 39.0–52.0)
Hemoglobin: 13 g/dL (ref 13.0–17.0)
MCH: 29.3 pg (ref 26.0–34.0)
MCHC: 32.3 g/dL (ref 30.0–36.0)
MCV: 90.5 fL (ref 78.0–100.0)
Platelets: 313 K/uL (ref 150–400)
RBC: 4.44 MIL/uL (ref 4.22–5.81)
RDW: 13.9 % (ref 11.5–15.5)
WBC: 18.3 K/uL — ABNORMAL HIGH (ref 4.0–10.5)

## 2017-03-22 LAB — BLOOD GAS, ARTERIAL
Acid-Base Excess: 2 mmol/L (ref 0.0–2.0)
Bicarbonate: 27 mmol/L (ref 20.0–28.0)
Drawn by: 441661
FIO2: 0.32
O2 Saturation: 91.4 %
Patient temperature: 99.2
pCO2 arterial: 46.7 mmHg (ref 32.0–48.0)
pH, Arterial: 7.382 (ref 7.350–7.450)
pO2, Arterial: 67.1 mmHg — ABNORMAL LOW (ref 83.0–108.0)

## 2017-03-22 LAB — EXPECTORATED SPUTUM ASSESSMENT W REFEX TO RESP CULTURE

## 2017-03-22 LAB — BASIC METABOLIC PANEL
Anion gap: 13 (ref 5–15)
Anion gap: 8 (ref 5–15)
BUN: 12 mg/dL (ref 6–20)
BUN: 10 mg/dL (ref 6–20)
CHLORIDE: 107 mmol/L (ref 101–111)
CO2: 22 mmol/L (ref 22–32)
CO2: 25 mmol/L (ref 22–32)
CREATININE: 0.89 mg/dL (ref 0.61–1.24)
CREATININE: 0.77 mg/dL (ref 0.61–1.24)
Calcium: 8.6 mg/dL — ABNORMAL LOW (ref 8.9–10.3)
Calcium: 8.6 mg/dL — ABNORMAL LOW (ref 8.9–10.3)
Chloride: 105 mmol/L (ref 101–111)
GFR calc Af Amer: >60 mL/min (ref >60)
GFR calc non Af Amer: >60 mL/min (ref >60)
Glucose, Bld: 154 mg/dL — ABNORMAL HIGH (ref 65–99)
Glucose, Bld: 192 mg/dL — ABNORMAL HIGH (ref 65–99)
POTASSIUM: 3.9 mmol/L (ref 3.5–5.1)
Potassium: 3.4 mmol/L — ABNORMAL LOW (ref 3.5–5.1)
SODIUM: 140 mmol/L (ref 135–145)
Sodium: 140 mmol/L (ref 135–145)

## 2017-03-22 LAB — TROPONIN I: Troponin I: <0.03 ng/mL (ref <0.03)

## 2017-03-22 LAB — BLOOD GAS, VENOUS
Acid-base deficit: 1.8 mmol/L (ref 0.0–2.0)
Bicarbonate: 24.8 mmol/L (ref 20.0–28.0)
Delivery systems: POSITIVE
FIO2: 0.5
Mode: POSITIVE
O2 Saturation: 62.9 %
PEEP: 5 cmH2O
Patient temperature: 98.6
Pressure support: 12 cmH2O
pCO2, Ven: 51.9 mmHg (ref 44.0–60.0)
pH, Ven: 7.3 (ref 7.250–7.430)
pO2, Ven: 36.4 mmHg (ref 32.0–45.0)

## 2017-03-22 LAB — MAGNESIUM: Magnesium: 3 mg/dL — ABNORMAL HIGH (ref 1.7–2.4)

## 2017-03-22 LAB — LACTIC ACID, PLASMA
LACTIC ACID, VENOUS: 1.1 mmol/L (ref 0.5–1.9)
LACTIC ACID, VENOUS: 0.6 mmol/L (ref 0.5–1.9)
Lactic Acid, Venous: 6.7 mmol/L (ref 0.5–1.9)

## 2017-03-22 LAB — MRSA PCR SCREENING: MRSA BY PCR: NEGATIVE

## 2017-03-22 MED ORDER — CHLORHEXIDINE GLUCONATE 0.12 % MT SOLN
15.0000 mL | Freq: Two times a day (BID) | OROMUCOSAL | Status: DC
  Administered 2017-03-22 – 2017-03-24 (×3): 15 mL via OROMUCOSAL
  Filled 2017-03-22 (×3): qty 15

## 2017-03-22 MED ORDER — ALBUTEROL SULFATE (2.5 MG/3ML) 0.083% IN NEBU: 2.5000 mg | INHALATION_SOLUTION | RESPIRATORY_TRACT | Status: DC | PRN

## 2017-03-22 MED ORDER — METHYLPREDNISOLONE SODIUM SUCC 125 MG IJ SOLR
60.0000 mg | Freq: Three times a day (TID) | INTRAMUSCULAR | Status: DC
  Administered 2017-03-22 (×3): 60 mg via INTRAVENOUS
  Filled 2017-03-22 (×3): qty 2

## 2017-03-22 MED ORDER — ALBUTEROL SULFATE (2.5 MG/3ML) 0.083% IN NEBU
INHALATION_SOLUTION | RESPIRATORY_TRACT | Status: AC
  Administered 2017-03-22: 5 mg via RESPIRATORY_TRACT
  Filled 2017-03-22: qty 6

## 2017-03-22 MED ORDER — DEXAMETHASONE 4 MG PO TABS
10.0000 mg | ORAL_TABLET | Freq: Once | ORAL | Status: AC
  Administered 2017-03-22: 10 mg via ORAL
  Filled 2017-03-22: qty 2

## 2017-03-22 MED ORDER — PIPERACILLIN-TAZOBACTAM 3.375 G IVPB
3.3750 g | Freq: Three times a day (TID) | INTRAVENOUS | Status: DC
  Administered 2017-03-22 – 2017-03-25 (×9): 3.375 g via INTRAVENOUS
  Filled 2017-03-22 (×10): qty 50

## 2017-03-22 MED ORDER — ORAL CARE MOUTH RINSE
15.0000 mL | Freq: Two times a day (BID) | OROMUCOSAL | Status: DC
  Administered 2017-03-22 – 2017-03-24 (×4): 15 mL via OROMUCOSAL

## 2017-03-22 MED ORDER — ASPIRIN 81 MG PO CHEW
81.0000 mg | CHEWABLE_TABLET | Freq: Every day | ORAL | Status: DC
  Administered 2017-03-22 – 2017-03-24 (×2): 81 mg via ORAL
  Filled 2017-03-22: qty 1

## 2017-03-22 MED ORDER — POTASSIUM CHLORIDE CRYS ER 20 MEQ PO TBCR
40.0000 meq | EXTENDED_RELEASE_TABLET | Freq: Once | ORAL | Status: AC
  Administered 2017-03-22: 40 meq via ORAL
  Filled 2017-03-22: qty 2

## 2017-03-22 MED ORDER — LORAZEPAM 2 MG/ML IJ SOLN
INTRAMUSCULAR | Status: AC
  Administered 2017-03-22: 1 mg
  Filled 2017-03-22: qty 1

## 2017-03-22 MED ORDER — SODIUM CHLORIDE 0.9 % IV BOLUS (SEPSIS)
500.0000 mL | Freq: Once | INTRAVENOUS | Status: AC
  Administered 2017-03-22: 500 mL via INTRAVENOUS

## 2017-03-22 MED ORDER — ALBUTEROL SULFATE HFA 108 (90 BASE) MCG/ACT IN AERS
2.0000 | INHALATION_SPRAY | RESPIRATORY_TRACT | Status: DC | PRN
  Administered 2017-03-22: 2 via RESPIRATORY_TRACT
  Filled 2017-03-22: qty 6.7

## 2017-03-22 MED ORDER — HEPARIN SODIUM (PORCINE) 5000 UNIT/ML IJ SOLN
5000.0000 [IU] | Freq: Three times a day (TID) | INTRAMUSCULAR | Status: DC
  Administered 2017-03-22 – 2017-03-25 (×9): 5000 [IU] via SUBCUTANEOUS
  Filled 2017-03-22 (×9): qty 1

## 2017-03-22 MED ORDER — VANCOMYCIN HCL IN DEXTROSE 1-5 GM/200ML-% IV SOLN
1000.0000 mg | Freq: Once | INTRAVENOUS | Status: AC
  Administered 2017-03-22: 1000 mg via INTRAVENOUS
  Filled 2017-03-22: qty 200

## 2017-03-22 MED ORDER — METOPROLOL TARTRATE 5 MG/5ML IV SOLN
10.0000 mg | INTRAVENOUS | Status: AC
  Administered 2017-03-22: 10 mg via INTRAVENOUS

## 2017-03-22 MED ORDER — ALBUTEROL (5 MG/ML) CONTINUOUS INHALATION SOLN: 10.0000 mg/h | INHALATION_SOLUTION | Freq: Once | RESPIRATORY_TRACT | Status: DC

## 2017-03-22 MED ORDER — AZITHROMYCIN 500 MG IV SOLR: 500.0000 mg | Freq: Once | INTRAVENOUS | Status: DC

## 2017-03-22 MED ORDER — MAGNESIUM SULFATE 50 % IJ SOLN: 1.0000 g | Freq: Once | INTRAMUSCULAR | Status: DC

## 2017-03-22 MED ORDER — ASPIRIN 81 MG PO CHEW
CHEWABLE_TABLET | ORAL | Status: AC
  Filled 2017-03-22: qty 4

## 2017-03-22 MED ORDER — NICOTINE 21 MG/24HR TD PT24
21.0000 mg | MEDICATED_PATCH | Freq: Every day | TRANSDERMAL | Status: DC
  Administered 2017-03-22 – 2017-03-25 (×5): 21 mg via TRANSDERMAL
  Filled 2017-03-22 (×5): qty 1

## 2017-03-22 MED ORDER — AMOXICILLIN-POT CLAVULANATE 875-125 MG PO TABS
1.0000 | ORAL_TABLET | Freq: Two times a day (BID) | ORAL | Status: DC
  Administered 2017-03-22: 1 via ORAL
  Filled 2017-03-22 (×2): qty 1

## 2017-03-22 MED ORDER — SODIUM CHLORIDE 0.9 % IV BOLUS (SEPSIS)
1000.0000 mL | Freq: Once | INTRAVENOUS | Status: AC
  Administered 2017-03-22: 1000 mL via INTRAVENOUS

## 2017-03-22 MED ORDER — METOPROLOL TARTRATE 5 MG/5ML IV SOLN: 5.0000 mg | INTRAVENOUS | Status: DC | PRN

## 2017-03-22 MED ORDER — GUAIFENESIN-DM 100-10 MG/5ML PO SYRP
5.0000 mL | ORAL_SOLUTION | ORAL | Status: DC | PRN
  Administered 2017-03-22: 5 mL via ORAL
  Filled 2017-03-22: qty 10

## 2017-03-22 MED ORDER — SODIUM CHLORIDE 0.9% FLUSH
10.0000 mL | INTRAVENOUS | Status: DC | PRN
  Administered 2017-03-24: 10 mL
  Filled 2017-03-22: qty 40

## 2017-03-22 MED ORDER — ALBUTEROL (5 MG/ML) CONTINUOUS INHALATION SOLN
INHALATION_SOLUTION | RESPIRATORY_TRACT | Status: AC
  Administered 2017-03-22: 10 mg/h via RESPIRATORY_TRACT
  Filled 2017-03-22: qty 20

## 2017-03-22 MED ORDER — METOPROLOL TARTRATE 5 MG/5ML IV SOLN
INTRAVENOUS | Status: AC
  Administered 2017-03-22: 10 mg
  Filled 2017-03-22: qty 5

## 2017-03-22 MED ORDER — DEXTROSE 5 % IV SOLN: 1.0000 g | Freq: Once | INTRAVENOUS | Status: DC

## 2017-03-22 MED ORDER — IPRATROPIUM-ALBUTEROL 0.5-2.5 (3) MG/3ML IN SOLN
3.0000 mL | Freq: Four times a day (QID) | RESPIRATORY_TRACT | Status: DC
  Administered 2017-03-22 – 2017-03-23 (×3): 3 mL via RESPIRATORY_TRACT
  Filled 2017-03-22 (×4): qty 3

## 2017-03-22 MED ORDER — MAGNESIUM SULFATE IN D5W 1-5 GM/100ML-% IV SOLN
1.0000 g | Freq: Once | INTRAVENOUS | Status: DC
  Filled 2017-03-22: qty 100

## 2017-03-22 MED ORDER — MAGNESIUM SULFATE 2 GM/50ML IV SOLN
2.0000 g | Freq: Once | INTRAVENOUS | Status: AC
Start: 2017-03-22 — End: 2017-03-22
  Administered 2017-03-22: 2 g via INTRAVENOUS
  Filled 2017-03-22: qty 50

## 2017-03-22 MED ORDER — DEXTROSE 5 % IV SOLN
500.0000 mg | INTRAVENOUS | Status: DC
  Administered 2017-03-22 – 2017-03-24 (×3): 500 mg via INTRAVENOUS
  Filled 2017-03-22 (×3): qty 500

## 2017-03-22 MED ORDER — IPRATROPIUM BROMIDE 0.02 % IN SOLN
1.0000 mg | Freq: Once | RESPIRATORY_TRACT | Status: DC
  Filled 2017-03-22: qty 5

## 2017-03-22 MED ORDER — AMIODARONE IV BOLUS ONLY 150 MG/100ML
INTRAVENOUS | Status: AC
  Filled 2017-03-22: qty 100

## 2017-03-22 MED ORDER — CHLORHEXIDINE GLUCONATE CLOTH 2 % EX PADS
6.0000 | MEDICATED_PAD | Freq: Every day | CUTANEOUS | Status: DC
  Administered 2017-03-22 – 2017-03-24 (×3): 6 via TOPICAL

## 2017-03-22 MED ORDER — LORAZEPAM 1 MG PO TABS
1.0000 mg | ORAL_TABLET | Freq: Once | ORAL | Status: AC
  Administered 2017-03-22: 1 mg via ORAL
  Filled 2017-03-22: qty 1

## 2017-03-22 MED ORDER — IPRATROPIUM BROMIDE 0.02 % IN SOLN
0.5000 mg | Freq: Once | RESPIRATORY_TRACT | Status: AC
Start: 2017-03-22 — End: 2017-03-22
  Administered 2017-03-22: 0.5 mg via RESPIRATORY_TRACT
  Filled 2017-03-22: qty 2.5

## 2017-03-22 MED ORDER — LORAZEPAM 2 MG/ML IJ SOLN: 0.5000 mg | Freq: Four times a day (QID) | INTRAMUSCULAR | Status: DC | PRN

## 2017-03-22 MED ORDER — SODIUM CHLORIDE 0.9 % IV SOLN
INTRAVENOUS | Status: DC
  Administered 2017-03-22 – 2017-03-23 (×2): via INTRAVENOUS

## 2017-03-22 MED ORDER — ALBUTEROL (5 MG/ML) CONTINUOUS INHALATION SOLN
10.0000 mg/h | INHALATION_SOLUTION | RESPIRATORY_TRACT | Status: DC
  Administered 2017-03-22: 10 mg/h via RESPIRATORY_TRACT

## 2017-03-22 MED ORDER — KETOROLAC TROMETHAMINE 15 MG/ML IJ SOLN: 15.0000 mg | Freq: Four times a day (QID) | INTRAMUSCULAR | Status: DC | PRN

## 2017-03-22 MED ORDER — METOPROLOL TARTRATE 25 MG PO TABS
50.0000 mg | ORAL_TABLET | Freq: Two times a day (BID) | ORAL | Status: DC
Start: 2017-03-22 — End: 2017-03-23
  Administered 2017-03-22: 50 mg via ORAL
  Filled 2017-03-22 (×2): qty 2

## 2017-03-22 MED ORDER — METHYLPREDNISOLONE SODIUM SUCC 125 MG IJ SOLR: 125.0000 mg | Freq: Once | INTRAMUSCULAR | Status: DC

## 2017-03-22 MED ORDER — LORAZEPAM 2 MG/ML IJ SOLN
0.5000 mg | Freq: Four times a day (QID) | INTRAMUSCULAR | Status: AC | PRN
  Administered 2017-03-23: 0.5 mg via INTRAVENOUS
  Filled 2017-03-22: qty 1

## 2017-03-22 MED ORDER — MAGNESIUM SULFATE 50 % IJ SOLN
1.0000 g | Freq: Once | INTRAMUSCULAR | Status: AC
  Administered 2017-03-22: 1 g via INTRAVENOUS
  Filled 2017-03-22: qty 2

## 2017-03-22 MED ORDER — SODIUM CHLORIDE 0.9% FLUSH
3.0000 mL | Freq: Two times a day (BID) | INTRAVENOUS | Status: DC
Start: 2017-03-22 — End: 2017-03-25
  Administered 2017-03-22 – 2017-03-25 (×4): 3 mL via INTRAVENOUS

## 2017-03-22 MED ORDER — IPRATROPIUM-ALBUTEROL 0.5-2.5 (3) MG/3ML IN SOLN
3.0000 mL | Freq: Once | RESPIRATORY_TRACT | Status: AC
  Administered 2017-03-22: 3 mL via RESPIRATORY_TRACT
  Filled 2017-03-22: qty 3

## 2017-03-22 MED ORDER — LORAZEPAM 2 MG/ML IJ SOLN: 1.0000 mg | INTRAMUSCULAR | Status: DC

## 2017-03-22 MED ORDER — ALBUTEROL SULFATE (2.5 MG/3ML) 0.083% IN NEBU
5.0000 mg | INHALATION_SOLUTION | Freq: Once | RESPIRATORY_TRACT | Status: AC
  Administered 2017-03-22: 5 mg via RESPIRATORY_TRACT

## 2017-03-22 MED ORDER — HALOPERIDOL LACTATE 5 MG/ML IJ SOLN
5.0000 mg | Freq: Once | INTRAMUSCULAR | Status: AC
  Administered 2017-03-22: 5 mg via INTRAMUSCULAR
  Filled 2017-03-22: qty 1

## 2017-03-22 MED ORDER — VANCOMYCIN HCL IN DEXTROSE 750-5 MG/150ML-% IV SOLN
750.0000 mg | Freq: Two times a day (BID) | INTRAVENOUS | Status: DC
  Administered 2017-03-22 – 2017-03-24 (×4): 750 mg via INTRAVENOUS
  Filled 2017-03-22 (×5): qty 150

## 2017-03-22 NOTE — ED Notes (Signed)
Patient breathing get worsen, M.D. Informed. Will continue to monitor.

## 2017-03-22 NOTE — Progress Notes (Signed)
  Peripherally Inserted Central Catheter/Midline Placement  The IV Nurse has discussed with the patient and/or persons authorized to consent for the patient, the purpose of this procedure and the potential benefits and risks involved with this procedure.  The benefits include less needle sticks, lab draws from the catheter, and the patient may be discharged home with the catheter. Risks include, but not limited to, infection, bleeding, blood clot (thrombus formation), and puncture of an artery; nerve damage and irregular heartbeat and possibility to perform a PICC exchange if needed/ordered by physician.  Alternatives to this procedure were also discussed.  Bard Power PICC patient education guide, fact sheet on infection prevention and patient information card has been provided to patient /or left at bedside.    PICC/Midline Placement Documentation  PICC Double Lumen 03/22/17 PICC Right Basilic 40 cm 0 cm (Active)  Indication for Insertion or Continuance of Line Poor Vasculature-patient has had multiple peripheral attempts or PIVs lasting less than 24 hours 03/22/2017  4:00 PM  Exposed Catheter (cm) 0 cm 03/22/2017  4:00 PM  Dressing Change Due 03/29/17 03/22/2017  4:00 PM       Stacie GlazeJoyce, Iniko Robles Horton 03/22/2017, 4:02 PM

## 2017-03-22 NOTE — Procedures (Signed)
Pt laying in bed.  Heart rate from ST in low 100s to wide complex tachycardia 180s.  BP stable. Elink notified.  Pt instructed to bear down.  No change in rate/rhythm.   Pt alert, denies CP.  Dr. Thedore MinsSingh paged immediately and to bedside.  EKG done.  Meds administered.  Rate/rhythm slowed and converted with IV lopressor.  Labs sent.

## 2017-03-22 NOTE — Progress Notes (Signed)
Pt refuses NIV at this time

## 2017-03-22 NOTE — H&P (Addendum)
[facility] H&P   Patient Demographics:    Lawrence Hanna, is a 46 y.o. male  MRN: [id number]   DOB - [birth year]  Admit Date - 03/21/2017  Outpatient Primary MD for the patient is Patient, No Pcp Per      Patient coming from: [facility], home before that  Chief Complaint  Patient presents with  . Shortness of Breath  . Suicidal      HPI:     Lawrence Hanna  is a 46 y.o. male, With history of IV drug use, no previously known medical problems except COPD who came to the [facility] ER to get heroin detox, he was in the cycle holding area for the last 1 day, last night around 3 AM he started developing shortness of breath along with productive cough and fevers. This morning he went into acute hypoxic respiratory failure requiring BiPAP, further workup which included blood work and CT scan chest revealed pneumonia and I was called to admit the patient. Pulmonary critical care was consulted by ER physician who recommended patient to be admitted to stepdown by hospitalist and they have nothing to offer at this time. He is currently on heroin withdrawal protocol.   Review of systems:    In addition to the HPI above,  Positive Fever-chills, No Headache, No changes with Vision or hearing, No problems swallowing food or Liquids, No Chest pain, positive productive cough and shortness of breath, No Abdominal pain, No Nausea or Vommitting, Bowel movements are regular, No Blood in stool or Urine, No dysuria, No new skin rashes or bruises, No new joints pains-aches,  No new weakness, tingling, numbness in any extremity, No recent weight gain or loss, No polyuria, polydypsia or polyphagia, No significant Mental Stressors.  A  full 10 point Review of Systems was done, except as stated above, all other Review of Systems were negative.   With Past History of the following :    Past Medical History:  Diagnosis Date  . COPD (chronic obstructive pulmonary disease) (HCC)   . Renal disorder       History reviewed. No pertinent surgical history.    Social History:     Social History  Substance Use Topics  . Smoking status: Current Every Day Smoker    Types: Cigarettes  . Smokeless tobacco: Never Used  .  Alcohol use No         Family History :    Family history of lung cancer   Home Medications:   Prior to Admission medications   Not on File     Allergies:     Allergies  Allergen Reactions  . Codeine Nausea Only     Physical Exam:   Vitals  Blood pressure 126/81, pulse 90, temperature 98.5 F (36.9 C), temperature source Oral, resp. rate 18, SpO2 100 %.   1. General thin middle-aged white male lying in bed in respiratory distress wearing BiPAP,  2. Normal affect and insight, Not Suicidal or Homicidal, Awake Alert, Oriented X 3.  3. No F.N deficits, ALL C.Nerves Intact, Strength 5/5 all 4 extremities, Sensation intact all 4 extremities, Plantars down going.  4. Ears and Eyes appear Normal, Conjunctivae clear, PERRLA. Moist Oral Mucosa.  5. Supple Neck, No JVD, No cervical lymphadenopathy appriciated, No Carotid Bruits.  6. Symmetrical Chest wall movement, Good air movement bilaterally, no wheezing at this time, few bibasilar rales  7. RRR, No Gallops, Rubs or Murmurs, No Parasternal Heave.  8. Positive Bowel Sounds, Abdomen Soft, No tenderness, No organomegaly appriciated,No rebound -guarding or rigidity.  9.  No Cyanosis, Normal Skin Turgor, No Skin Rash or Bruise. Multiple track marks and injection marks noted in the left arm.  10. Good muscle tone,  joints appear normal , no effusions, Normal ROM.  11. No Palpable Lymph Nodes in Neck or Axillae      Data Review:     CBC  Recent Labs Lab 03/21/17 0913 03/22/17 0953  WBC 9.9 18.3*  HGB 13.0 13.0  HCT 39.6 40.2  PLT 276 313  MCV 90.2 90.5  MCH 29.6 29.3  MCHC 32.8 32.3  RDW 13.8 13.9  LYMPHSABS 0.6*  --   MONOABS 0.2  --   EOSABS 0.0  --   BASOSABS 0.0  --    ------------------------------------------------------------------------------------------------------------------  Chemistries   Recent Labs Lab 03/21/17 0913 03/22/17 0953  NA 136 140  K 3.4* 3.4*  CL 104 105  CO2 25 22  GLUCOSE 157* 154*  BUN 9 12  CREATININE 0.87 0.89  CALCIUM 8.5* 8.6*  AST 21  --   ALT 11*  --   ALKPHOS 64  --   BILITOT 0.7  --    ------------------------------------------------------------------------------------------------------------------ CrCl cannot be calculated (Unknown ideal weight.). ------------------------------------------------------------------------------------------------------------------ No results for input(s): TSH, T4TOTAL, T3FREE, THYROIDAB in the last 72 hours.  Invalid input(s): FREET3  Coagulation profile No results for input(s): INR, PROTIME in the last 168 hours. -------------------------------------------------------------------------------------------------------------------  Recent Labs  03/21/17 0913  DDIMER 1.83*   -------------------------------------------------------------------------------------------------------------------  Cardiac Enzymes  Recent Labs Lab 03/21/17 0915  TROPONINI <0.03   ------------------------------------------------------------------------------------------------------------------ No results found for: BNP   ---------------------------------------------------------------------------------------------------------------  Urinalysis    Component Value Date/Time   COLORURINE Yellow 11/11/2012 1805   APPEARANCEUR Clear 11/11/2012 1805   LABSPEC 1.008 11/11/2012 1805   PHURINE 6.0 11/11/2012 1805   GLUCOSEU Negative  11/11/2012 1805   HGBUR 1+ 11/11/2012 1805   BILIRUBINUR Negative 11/11/2012 1805   KETONESUR Negative 11/11/2012 1805   PROTEINUR Negative 11/11/2012 1805   NITRITE Negative 11/11/2012 1805   LEUKOCYTESUR 1+ 11/11/2012 1805    ----------------------------------------------------------------------------------------------------------------   Imaging Results:    Dg Chest 2 View  Result Date: 03/21/2017 CLINICAL DATA:  Shortness of breath, cough, congestion EXAM: CHEST  2 VIEW COMPARISON:  None. FINDINGS: Lungs are clear.  No pleural effusion or pneumothorax. The  heart is normal in size. Visualized osseous structures are within normal limits. IMPRESSION: Normal chest radiograph. Electronically Signed   By: Charline BillsSriyesh  Krishnan M.D.   On: 03/21/2017 08:52   Ct Angio Chest Pe W Or Wo Contrast  Result Date: 03/21/2017 CLINICAL DATA:  Shortness of breath, cough EXAM: CT ANGIOGRAPHY CHEST WITH CONTRAST TECHNIQUE: Multidetector CT imaging of the chest was performed using the standard protocol during bolus administration of intravenous contrast. Multiplanar CT image reconstructions and MIPs were obtained to evaluate the vascular anatomy. CONTRAST:  100 mL Isovue 370 IV COMPARISON:  Chest radiographs dated 03/21/2017. FINDINGS: Cardiovascular: Satisfactory opacification the bilateral pulmonary artery to the segmental level. Evaluation is mildly constrained due to respiratory motion. No evidence pulmonary embolism. The heart is normal in size.  No pericardial effusion. No evidence of thoracic aortic aneurysm. Mediastinum/Nodes: No suspicious mediastinal lymphadenopathy. Visualized thyroid is unremarkable. Lungs/Pleura: Evaluation of the lung parenchyma is constrained by respiratory motion. Mild centrilobular and paraseptal emphysematous changes, upper lobe predominant. Very mild patchy opacity in the medial right middle lobe (series 7/ image 66), likely infectious. Minimal left basilar opacity, likely  atelectasis. No suspicious pulmonary nodules. No pleural effusion or pneumothorax. Upper Abdomen: Visualized upper abdomen is unremarkable. Musculoskeletal: Visualized osseous structures are within normal limits. Review of the MIP images confirms the above findings. IMPRESSION: No evidence pulmonary embolism. Very mild patchy opacity in the medial right middle lobe, likely reflecting mild infection. Electronically Signed   By: Charline BillsSriyesh  Krishnan M.D.   On: 03/21/2017 10:45        Assessment & Plan:     1. Acute hypoxic respiratory failure due to most likely aspiration pneumonia while [facility]. Patient currently on BiPAP this will be continued, will be transferred to stepdown, blood and sputum cultures, will be placed on IV vancomycin and Zosyn for now as I suspect a hospital-acquired bugs, will add azithromycin as well since he was recently out in the community. Check Lactic acid. If responds well can be transitioned to Unasyn within the next 24-48 hours. Continue supportive care with oxygen supplementation via BiPAP on nasal cannula as needed along with breathing treatments. For now clear liquid diet only as he is more or less BiPAP dependent for now. Known pulmonary critical care was consulted by ER they have nothing to offer at this time. Condition guarded++, may get intubated.   Addendum 2.20pm - lactic acid > 6, likely septic, IVF bolus and repeat lactate.    2. IV drug use. Multiple track marks in his left arm. Blood cultures, baseline echocardiogram. Counseled to quit. Continue Catapres per withdrawal protocol along with Bentyl, as needed IV Ativan. Avoid narcotics.  3. COPD. Not convinced there is a COPD exacerbation, minimal to no wheezing. Since high-dose steroids have been started in the ER will continue to low dose can be stopped in the next 1-2 days.  4. Hypokalemia. Replace.  5. Elevated d-dimer. Likely due to infection. CT  angiogram lungs unremarkable. No swelling in the legs.   Addendum - 4.30 pm - patient went into SVT with Aberrancy - DW Dr Rennis GoldenHilty, resolved with IV lopressor, placed on PO lopressor, IV Mag ASA, check TTE, Cards to see in am.    DVT Prophylaxis Heparin    AM Labs Ordered, also please review Full Orders  Family Communication: Admission, patients condition and plan of care including tests being ordered have been discussed with the patient  who indicates understanding and agree with  the plan and Code Status.  Code Status Full  Likely DC to  TBD  Condition GUARDED    Consults called: PCCM by ER    Admission status: Inpt    Time spent in minutes : 35   Susa Raring M.D on 03/22/2017 at 11:27 AM  Between 7am to 7pm - Pager - [phone] ( page via [facility], text pages only, please mention full 10 digit call back number).  After 7pm go to [email or web address] - password [facility]  [facility] - Office  [phone]

## 2017-03-22 NOTE — Progress Notes (Signed)
Patient refuse to check his VS. Patient get agitated.

## 2017-03-22 NOTE — ED Notes (Signed)
Pt is on BiPAP

## 2017-03-22 NOTE — Progress Notes (Signed)
Pt gave poor effort with peak flow.  Three attempts made but pt unable to do it effectively.

## 2017-03-22 NOTE — Progress Notes (Signed)
Scheduled neb not given at this time, pt is sleeping comfortably. PRN neb will be given if needed. NT at bedside.

## 2017-03-22 NOTE — Progress Notes (Signed)
RT discovered (at approx 1434) PT off BiPAP and on [place]. PT appears to be sleeping at this time.

## 2017-03-22 NOTE — Progress Notes (Signed)
Pharmacy Antibiotic Note  Lawrence Hanna is a 46 y.o. male admitted on 03/21/2017 with pneumonia.  Pharmacy has been consulted for vancomycin and zosyn dosing. Patient IVDU so MD wants to start empiric broad spectrum antibiotics with vancomycin, zosyn, and azithromycin. Plan to narrow to Unasyn and azithromycin if cultures unrevealing.  CTA chest negative for PE, infiltrate seen.   Today, 03/22/2017  Renal: SCr WNL  WBC elevated  afebrile  Plan:  Vancomycin 1gm x 1 then 750mg  IV q12h  Check trough if remains on vanco > 72h  Zosyn 3.375gm IV q8h over 4h infusion  Daily SCr due to increased risk of nephrotoxicity with concomitant vancomycin and zosyn   Note on PRN toradol, contrast given 5/28  Follow culture and narrow ASAP     Temp (24hrs), Avg:98.5 F (36.9 C), Min:98.5 F (36.9 C), Max:98.5 F (36.9 C)   Recent Labs Lab 03/21/17 0913 03/22/17 0953  WBC 9.9 18.3*  CREATININE 0.87 0.89    CrCl cannot be calculated (Unknown ideal weight.).    Allergies  Allergen Reactions  . Codeine Nausea Only    Antimicrobials this admission: 5/28 ceftriaxone/azithromycin x 1 5/28 vancomycin >> 5/28 zosyn >> 5/28 azithromycin >>  Dose adjustments this admission:  Microbiology results: 5/28 MRSA PCR:  5/28 BCx:  5/28 HIV ab:  5/28 sputum: ordered  Thank you for allowing pharmacy to be a part of this patient's care.  Juliette Alcideustin [place] Crance, PharmD, BCPS.   Pager: [phone] 03/22/2017 12:31 PM

## 2017-03-22 NOTE — Progress Notes (Signed)
Pt states that he doesn't want to use the NIV machine because it is aggravating and uncomfortable.  Pt SATs are stable at 96% on 3L. NT at bedside.

## 2017-03-22 NOTE — Progress Notes (Signed)
CRITICAL VALUE ALERT  Critical Value:  Lactic Acid 6.7     Date & Time Notied:  03/22/17 1423   Provider Notified: Dr. Thedore MinsSingh      Orders Received/Actions taken: Once IV PICC placed Give 1.5 L NS and then repeat Lactic Acid

## 2017-03-22 NOTE — ED Provider Notes (Signed)
Assumed care from Dr. Nicanor AlconPalumbo at 7 AM. Briefly, the patient is a 46 y.o. male with PMHx of  has a past medical history of COPD (chronic obstructive pulmonary disease) (HCC) and Renal disorder. here with reported desire for detoxification. However, on my assessment, patient has heart rate 110s, respiratory rate in the 40s, with diffuse wheezing. I reviewed patient's labs and imaging and CT scan does show positive pneumonia. I'm concerned he has acute respiratory failure with hypoxia secondary to community acquired pneumonia. He demonstrates severe increased work of breathing. According to nursing, patient was given a breathing treatment overnight but this has not significant improved his symptoms..   Labs Reviewed  CBC WITH DIFFERENTIAL/PLATELET - Abnormal; Notable for the following:       Result Value   Neutro Abs 9.1 (*)    Lymphs Abs 0.6 (*)    All other components within normal limits  COMPREHENSIVE METABOLIC PANEL - Abnormal; Notable for the following:    Potassium 3.4 (*)    Glucose, Bld 157 (*)    Calcium 8.5 (*)    Total Protein 6.4 (*)    Albumin 3.1 (*)    ALT 11 (*)    All other components within normal limits  D-DIMER, QUANTITATIVE (NOT AT [facility]) - Abnormal; Notable for the following:    D-Dimer, Quant 1.83 (*)    All other components within normal limits  RAPID URINE DRUG SCREEN, HOSP PERFORMED - Abnormal; Notable for the following:    Opiates POSITIVE (*)    Cocaine POSITIVE (*)    All other components within normal limits  ETHANOL  TROPONIN I    Course of Care: -Patient noted to have progressively worsening shortness of breath. I discussed with respiratory. He refuses continuous but will place on BiPAP for work of breathing and tachypnea. I have started him on Rocephin and azithromycin. Note, pt already started on ABX prior to my assessment so Cx held. Blood gas is pending.  -Blood gas is overall reassuring, although this is after breathing treatments and BiPAP. Repeat labs  show elevated white blood cell count. Electrolytes are acceptable. Patient has significantly improved aeration on repeat exam, though he remains tachypneic. He also remains mildly hypoxic. Will admit for hypoxemia and COPD exacerbation. Patient was reevaluated by psychiatry and will need formal psychiatry clearance as he did report suicidal ideations. Otherwise, I suspect his tachypnea is secondary to COPD as well as multifactorial in setting of possible opiate withdrawal. He has been started on supportive medications. Believe he will need step down care given increased WOB, tachypnea.   Clinical Impression: 1. Hypoxia   2. SOB (shortness of breath)   3. Chronic bronchitis, unspecified chronic bronchitis type (HCC)   4. Renal disorder   5. Community acquired pneumonia, unspecified laterality     .Critical Care Performed by: Shaune PollackISAACS, Myrick Mcnairy Authorized by: Shaune PollackISAACS, Donovin Kraemer   Critical care provider statement:    Critical care time (minutes):  35   CRITICAL CARE Performed by: Dollene Clevelandameron Isascs   Total critical care time: 35 minutes  Critical care time was exclusive of separately billable procedures and treating other patients.  Critical care was necessary to treat or prevent imminent or life-threatening deterioration.  Critical care was time spent personally by me on the following activities: development of treatment plan with patient and/or surrogate as well as nursing, discussions with consultants, evaluation of patient's response to treatment, examination of patient, obtaining history from patient or surrogate, ordering and performing treatments and interventions, ordering and review  of laboratory studies, ordering and review of radiographic studies, pulse oximetry and re-evaluation of patient's condition.      Shaune Pollack, MD 03/22/17 [phone]

## 2017-03-22 NOTE — ED Notes (Addendum)
EDP at bedside for evaluation. 

## 2017-03-22 NOTE — ED Provider Notes (Addendum)
459 am: Based on CT results and persistent complaints onm 2nd and 3rd shift of SOB have ordered albuterol MDIs Q4hrs and Augmentin for PNA seen on CT chest   520 See for repeat wheezing, nurse informed patient cannot refuse vitals and regular BP and O2 saturation must be performed.    NCAT RRR Diminished BS with apical wheeze NABS, Soft Is both jittery and anxious   First nicotine patch ordered with neb  555 Called to see patient and audible wheezing was worse and patient's nurse concerned.    Patient is audibly wheezing but has markedly improved air movement on exam  620 Discussed with RT will start CAT 10 mg and patient will need reassessment in am       Lawrence Yim, MD 03/22/17 [id number]

## 2017-03-23 ENCOUNTER — Encounter (HOSPITAL_COMMUNITY): Payer: Self-pay | Admitting: Physician Assistant

## 2017-03-23 ENCOUNTER — Inpatient Hospital Stay (HOSPITAL_COMMUNITY): Payer: Medicaid Other

## 2017-03-23 DIAGNOSIS — J9602 Acute respiratory failure with hypercapnia: Secondary | ICD-10-CM

## 2017-03-23 DIAGNOSIS — J439 Emphysema, unspecified: Secondary | ICD-10-CM

## 2017-03-23 DIAGNOSIS — I509 Heart failure, unspecified: Secondary | ICD-10-CM

## 2017-03-23 DIAGNOSIS — J181 Lobar pneumonia, unspecified organism: Secondary | ICD-10-CM

## 2017-03-23 DIAGNOSIS — I471 Supraventricular tachycardia: Secondary | ICD-10-CM

## 2017-03-23 LAB — BASIC METABOLIC PANEL
Anion gap: 5 (ref 5–15)
BUN: 14 mg/dL (ref 6–20)
CHLORIDE: 107 mmol/L (ref 101–111)
CO2: 28 mmol/L (ref 22–32)
Calcium: 8.6 mg/dL — ABNORMAL LOW (ref 8.9–10.3)
Creatinine, Ser: 0.94 mg/dL (ref 0.61–1.24)
GFR calc Af Amer: >60 mL/min (ref >60)
GFR calc non Af Amer: >60 mL/min (ref >60)
GLUCOSE: 170 mg/dL — AB (ref 65–99)
POTASSIUM: 4.3 mmol/L (ref 3.5–5.1)
SODIUM: 140 mmol/L (ref 135–145)

## 2017-03-23 LAB — CBC
HEMATOCRIT: 39.1 % (ref 39.0–52.0)
Hemoglobin: 12.3 g/dL — ABNORMAL LOW (ref 13.0–17.0)
MCH: 28.9 pg (ref 26.0–34.0)
MCHC: 31.5 g/dL (ref 30.0–36.0)
MCV: 91.8 fL (ref 78.0–100.0)
PLATELETS: 308 10*3/uL (ref 150–400)
RBC: 4.26 MIL/uL (ref 4.22–5.81)
RDW: 14.2 % (ref 11.5–15.5)
WBC: 16 10*3/uL — ABNORMAL HIGH (ref 4.0–10.5)

## 2017-03-23 LAB — TSH: TSH: 0.571 u[IU]/mL (ref 0.350–4.500)

## 2017-03-23 LAB — HIV ANTIBODY (ROUTINE TESTING W REFLEX): HIV Screen 4th Generation wRfx: NONREACTIVE

## 2017-03-23 LAB — ECHOCARDIOGRAM COMPLETE
HEIGHTINCHES: 67 in
WEIGHTICAEL: 2010.6 [oz_av]

## 2017-03-23 MED ORDER — LEVALBUTEROL HCL 1.25 MG/0.5ML IN NEBU
1.2500 mg | INHALATION_SOLUTION | Freq: Four times a day (QID) | RESPIRATORY_TRACT | Status: DC
  Administered 2017-03-23 – 2017-03-25 (×4): 1.25 mg via RESPIRATORY_TRACT
  Filled 2017-03-23 (×7): qty 0.5

## 2017-03-23 MED ORDER — FENTANYL CITRATE (PF) 100 MCG/2ML IJ SOLN
100.0000 ug | Freq: Once | INTRAMUSCULAR | Status: AC
  Administered 2017-03-23: 100 ug via INTRAVENOUS
  Filled 2017-03-23: qty 2

## 2017-03-23 MED ORDER — IPRATROPIUM-ALBUTEROL 0.5-2.5 (3) MG/3ML IN SOLN: 3.0000 mL | Freq: Three times a day (TID) | RESPIRATORY_TRACT | Status: DC

## 2017-03-23 MED ORDER — DEXMEDETOMIDINE HCL IN NACL 400 MCG/100ML IV SOLN
0.4000 ug/kg/h | INTRAVENOUS | Status: DC
  Administered 2017-03-23 (×2): 1.2 ug/kg/h via INTRAVENOUS
  Administered 2017-03-23: 0.6 ug/kg/h via INTRAVENOUS
  Administered 2017-03-24: 1 ug/kg/h via INTRAVENOUS
  Administered 2017-03-24: 1.1 ug/kg/h via INTRAVENOUS
  Administered 2017-03-24: 0.7 ug/kg/h via INTRAVENOUS
  Administered 2017-03-25: 1.1 ug/kg/h via INTRAVENOUS
  Filled 2017-03-23 (×8): qty 100

## 2017-03-23 MED ORDER — THIAMINE HCL 100 MG/ML IJ SOLN
100.0000 mg | Freq: Every day | INTRAMUSCULAR | Status: DC
  Administered 2017-03-23 – 2017-03-24 (×2): 100 mg via INTRAVENOUS
  Filled 2017-03-23 (×2): qty 2

## 2017-03-23 MED ORDER — IPRATROPIUM-ALBUTEROL 0.5-2.5 (3) MG/3ML IN SOLN: 3.0000 mL | Freq: Four times a day (QID) | RESPIRATORY_TRACT | Status: DC

## 2017-03-23 MED ORDER — LORAZEPAM 2 MG/ML IJ SOLN: 1.0000 mg | INTRAMUSCULAR | Status: DC | PRN

## 2017-03-23 MED ORDER — METHYLPREDNISOLONE SODIUM SUCC 125 MG IJ SOLR
60.0000 mg | Freq: Three times a day (TID) | INTRAMUSCULAR | Status: DC
  Administered 2017-03-23 – 2017-03-24 (×3): 60 mg via INTRAVENOUS
  Filled 2017-03-23 (×3): qty 2

## 2017-03-23 MED ORDER — SODIUM CHLORIDE 0.9 % IV SOLN
INTRAVENOUS | Status: DC
  Administered 2017-03-23 – 2017-03-24 (×2): via INTRAVENOUS

## 2017-03-23 MED ORDER — LORAZEPAM 2 MG/ML IJ SOLN
2.0000 mg | Freq: Once | INTRAMUSCULAR | Status: AC
  Administered 2017-03-23: 2 mg via INTRAVENOUS

## 2017-03-23 MED ORDER — FENTANYL CITRATE (PF) 100 MCG/2ML IJ SOLN
25.0000 ug | INTRAMUSCULAR | Status: DC | PRN
  Administered 2017-03-23: 100 ug via INTRAVENOUS
  Administered 2017-03-23: 50 ug via INTRAVENOUS
  Administered 2017-03-24: 100 ug via INTRAVENOUS
  Filled 2017-03-23 (×3): qty 2

## 2017-03-23 MED ORDER — FENTANYL CITRATE (PF) 100 MCG/2ML IJ SOLN
INTRAMUSCULAR | Status: AC
  Filled 2017-03-23: qty 2

## 2017-03-23 MED ORDER — FOLIC ACID 5 MG/ML IJ SOLN
1.0000 mg | Freq: Every day | INTRAMUSCULAR | Status: DC
  Administered 2017-03-23 – 2017-03-24 (×2): 1 mg via INTRAVENOUS
  Filled 2017-03-23 (×3): qty 0.2

## 2017-03-23 MED ORDER — IPRATROPIUM BROMIDE 0.02 % IN SOLN
0.5000 mg | Freq: Four times a day (QID) | RESPIRATORY_TRACT | Status: DC
  Administered 2017-03-23 – 2017-03-25 (×4): 0.5 mg via RESPIRATORY_TRACT
  Filled 2017-03-23 (×7): qty 2.5

## 2017-03-23 MED ORDER — LEVALBUTEROL HCL 0.63 MG/3ML IN NEBU: 0.6300 mg | INHALATION_SOLUTION | RESPIRATORY_TRACT | Status: DC | PRN

## 2017-03-23 MED ORDER — BUDESONIDE 0.5 MG/2ML IN SUSP
0.5000 mg | Freq: Two times a day (BID) | RESPIRATORY_TRACT | Status: DC
  Administered 2017-03-23 – 2017-03-25 (×3): 0.5 mg via RESPIRATORY_TRACT
  Filled 2017-03-23 (×4): qty 2

## 2017-03-23 MED ORDER — FENTANYL CITRATE (PF) 100 MCG/2ML IJ SOLN
INTRAMUSCULAR | Status: AC
  Administered 2017-03-23: 100 ug
  Filled 2017-03-23: qty 2

## 2017-03-23 MED ORDER — LORAZEPAM 2 MG/ML IJ SOLN
0.5000 mg | Freq: Once | INTRAMUSCULAR | Status: AC
  Administered 2017-03-23: 0.5 mg via INTRAVENOUS
  Filled 2017-03-23: qty 1

## 2017-03-23 MED ORDER — LORAZEPAM 2 MG/ML IJ SOLN
INTRAMUSCULAR | Status: AC
  Filled 2017-03-23: qty 1

## 2017-03-23 NOTE — Progress Notes (Signed)
Pt agitated and pulling lines and telemetry leads. NT at bedside unable to redirect. RN and charge RN attempted to put leads back on, but pt would not allow placement on chest. Placed leads on pt's back and secured. Gave 0.5mg  Ativan PRN, ineffective for anxiety/agitation but pt agreed to get back in bed and leave leads on. Notified MD. Pt now resting.   BP 144/77, RR 42, O2 99 on 5L, and HR 85 w/ frequent PACs.  Will continue to monitor.

## 2017-03-23 NOTE — Care Management Note (Signed)
Case Management Note  Patient Details  Name: Leane ParaSteven Bogue MRN: [id number] Date of Birth: 04/27/1971  Subjective/Objective:      sucidical              Action/Plan: Date:  Mar 23, 2017 Chart reviewed for concurrent status and case management needs. Will continue to follow patient progress. Discharge Planning: following for needs Expected discharge date: [id number] Marcelle SmilingRhonda Mickayla Trouten, BSN, [place], [place]   [phone]  Expected Discharge Date:                  [facility]  [facility]  In-House Referral:  Clinical Social Work  Discharge planning Services  CM Consult  Post Acute Care Choice:    Choice offered to:     DME Arranged:    DME Agency:     HH Arranged:    HH Agency:     Status of Service:  In process, will continue to follow  If discussed at Long Length of Stay Meetings, dates discussed:    Additional Comments:  Golda AcreDavis, Avarae Zwart Lynn, RN 03/23/2017, 8:21 AM

## 2017-03-23 NOTE — Progress Notes (Signed)
RT arrived to Pt room to give Pt his breathing treatments.  Pt refused stating that he was not taking anything until he was given some jello to eat.  Explained to Pt that he was not allowed to have any jello but still refused.  Discussed with RN.

## 2017-03-23 NOTE — Consult Note (Signed)
PULMONARY / CRITICAL CARE MEDICINE   Name: Lothar Prehn MRN: [id number] DOB: 31-Aug-1971    ADMISSION DATE:  03/21/2017 CONSULTATION DATE:  03/23/17  REFERRING MD:  Dr. Sunnie Nielsen   CHIEF COMPLAINT:  Agitated delirium, possible aspiration PNA   HISTORY OF PRESENT ILLNESS:  46 y/o M with PMH of tobacco abuse / COPD (followed by Dr. Blenda Nicely) who presented to [facility] on 5/27 with complaints of shortness of breath, cough, suicidal ideations and detox request.    The patient remained in the PSY holding area for approximately 24 hours.  During that time, documentation shows increasing agitation and shortness of breath.  He was seen by [facility] and admitted for evaluation of respiratory symptoms.  The patients UDS was positive for opiates and cocaine.  ETOH was negative on admit.  Due to shortness of breath, a CTA of the chest was obtained which ruled out PE but showed very mild patchy opacity in the medial RML.  He was started on the Clonidine withdrawal protocol.  He became progressively more agitated and there were concerns for possible withdrawal.  He was treated with vancomycin / zosyn for possible PNA.  ABG 5/28 > 7.382 / 46 / 67 / 27.    PCCM consulted 5/29 for evaluation.    The patient reports he typically uses 1-2 gm of heroin per day.  He states he does not drink heavy > typically a "couple of beers per day".  He also uses cocaine.     PAST MEDICAL HISTORY :  He  has a past medical history of COPD (chronic obstructive pulmonary disease) (HCC) and Renal disorder.  PAST SURGICAL HISTORY: He  has no past surgical history on file.  Allergies  Allergen Reactions  . Codeine Nausea Only    No current facility-administered medications on file prior to encounter.    No current outpatient prescriptions on file prior to encounter.    FAMILY HISTORY:  His has no family status information on file.    SOCIAL HISTORY: He  reports that he has been smoking Cigarettes.  He has never used smokeless  tobacco. He reports that he does not drink alcohol.  REVIEW OF SYSTEMS:  Unable to complete as patient is altered / agitated.   SUBJECTIVE:  RN reports agitation / delirium.   VITAL SIGNS: BP (!) 158/95   Pulse (!) 102   Temp 98 F (36.7 C) (Oral)   Resp 15   Ht 5\' 7"  (1.702 m)   Wt 125 lb 10.6 oz (57 kg)   SpO2 97%   BMI 19.68 kg/m   HEMODYNAMICS:    VENTILATOR SETTINGS: FiO2 (%):  [40 %] 40 %  INTAKE / OUTPUT: I/O last 3 completed shifts: In: 4193.3 [P.O.:360; I.V.:1533.3; IV Piggyback:2300] Out: 2525 [Urine:2525]  PHYSICAL EXAMINATION: General: thin adult male, agitated  HEENT: MM pink/dry, no jvd PSY: calm, mild intermittent agitation  Neuro: awake, alert, answers questions appropriately  CV: s1s2 rrr, no m/r/g PULM: even/non-labored, lungs bilaterally coarse with wheezing WU:JWJX, non-tender, bsx4 active  Extremities: warm/dry, no edema  Skin: no rashes or lesions   LABS:  BMET  Recent Labs Lab 03/22/17 0953 03/22/17 1637 03/23/17 0445  NA 140 140 140  K 3.4* 3.9 4.3  CL 105 107 107  CO2 22 25 28   BUN 12 10 14   CREATININE 0.89 0.77 0.94  GLUCOSE 154* 192* 170*    Electrolytes  Recent Labs Lab 03/22/17 0953 03/22/17 1637 03/23/17 0445  CALCIUM 8.6* 8.6* 8.6*  MG  --  3.0*  --     CBC  Recent Labs Lab 03/21/17 0913 03/22/17 0953 03/23/17 0445  WBC 9.9 18.3* 16.0*  HGB 13.0 13.0 12.3*  HCT 39.6 40.2 39.1  PLT 276 313 308    Coag's No results for input(s): APTT, INR in the last 168 hours.  Sepsis Markers  Recent Labs Lab 03/22/17 1242 03/22/17 1740 03/22/17 2000  LATICACIDVEN 6.7* 1.1 0.6    ABG  Recent Labs Lab 03/22/17 2320  PHART 7.382  PCO2ART 46.7  PO2ART 67.1*    Liver Enzymes  Recent Labs Lab 03/21/17 0913  AST 21  ALT 11*  ALKPHOS 64  BILITOT 0.7  ALBUMIN 3.1*    Cardiac Enzymes  Recent Labs Lab 03/21/17 0915 03/22/17 1637  TROPONINI <0.03 <0.03    Glucose No results for input(s):  GLUCAP in the last 168 hours.  Imaging No results found.   STUDIES:  CTA Chest 5/27 >> neg for PE, very mild patchy opacity in the RML  CULTURES: BCx2 5/28 >>  Sputum 5/28 >>   ANTIBIOTICS: Vanco 5/28 >>  Zosyn 5/28 >>   SIGNIFICANT EVENTS: 5/27  [facility], request for detox  5/29  PCCM consulted for respiratory distress, ? Withdrawal   LINES/TUBES:   DISCUSSION: 46 y/o M admitted on 5/27 for drug detox and suicidal ideations.  Developed progressive agitation / delirium and respiratory distress.  PCCM consulted on 5/29 for increased work of breathing, possible withdrawal.    ASSESSMENT / PLAN:  PULMONARY A: Acute Respiratory Distress  RML Airspace Disease - seen in 11/2016 as well, slightly increased COPD with Acute Exacerbation  Hx Pulmonary Nodules - seen on PET in 11/2016, not hypermetabolic P:   Trial BiPAP for increased WOB, may require intubation  Adjust bronchodilators > duoneb Q6 Solu-medrol 60 mg Q8  Pulmicort BID Intermittent CXR  Nicotine patch   CARDIOVASCULAR A:  Tachycardia  P:  ICU monitoring of hemodynamics  ASA 81 mg QD  RENAL A:   No acute issues  P:   Trend BMP / urinary output Replace electrolytes as indicated Avoid nephrotoxic agents, ensure adequate renal perfusion  GASTROINTESTINAL A:   At Risk Aspiration  P:   NPO    HEMATOLOGIC A:   Leukocytosis - reactive vs infection  P:  Trend CBC  INFECTIOUS A:   Leukocytosis RML Airspace Disease - slightly increased from prior PET P:   Follow fever curve, WBC trend  ABX as above  Cultures as above   ENDOCRINE A:   Hyperglycemia  P:   Monitor glucose on BMP   NEUROLOGIC A:   Agitated Delirium - ? Withdrawal, ETOH negative on admit, UDS positive for cocaine, opiates.  Suicidal Ideation  Cocaine + Heroin Abuse  P:   RASS goal: n/a  Thiamine, folate, MVI  PRN fentanyl for pain / opiate hx  PRN ativan  May need Haldol  Safety sitter  at bedside for suicidal ideation   FAMILY  - Updates:  Mother updated via phone per Dr. Christene Slatese Dios   - Inter-disciplinary family meet or Palliative Care meeting due by:  6/5   CC Time:  35 minutes   Canary BrimBrandi Ollis, NP-C [facility] Pgr: [phone] or if no answer [phone] 03/23/2017, 8:13 AM  ATTENDING NOTE / ATTESTATION NOTE :   I have discussed the case with the resident/APP  Canary BrimBrandi Ollis NP  I agree with the resident/APP's  history, physical examination, assessment, and plans.  I have edited the above note and modified it according to our agreed history, physical examination, assessment and plan.   Briefly, 46 y/o M with PMH of tobacco abuse / COPD (followed by Dr. Blenda Nicely) who presented to [facility] on 5/27 with complaints of shortness of breath, cough, suicidal ideations and detox request.    The patient remained in the PSY holding area for approximately 24 hours.  During that time, documentation shows increasing agitation and shortness of breath.  He was seen by [facility] and admitted for evaluation of respiratory symptoms.  The patients UDS was positive for opiates and cocaine.  ETOH was negative on admit.  Due to shortness of breath, a CTA of the chest was obtained which ruled out PE but showed very mild patchy opacity in the medial RML.  He was started on the Clonidine withdrawal protocol.  He became progressively more agitated and there were concerns for possible withdrawal.  He was treated with vancomycin / zosyn for possible PNA.  ABG 5/28 > 7.382 / 46 / 67 / 27.    He was tachypneic on [place].  Significantly improved on BiPaP.   Vitals:  Vitals:   03/23/17 1100 03/23/17 1200 03/23/17 1300 03/23/17 1400  BP: 125/81 (!) 141/88 (!) 146/94 (!) 146/97  Pulse: (!) 56 (!) 54 (!) 50 (!) 51  Resp: (!) 22 (!) 24 (!) 24 (!) 23  Temp:  97.5 F (36.4 C)    TempSrc:  Oral    SpO2: 98% 98% 99% 99%  Weight:      Height:        Constitutional/General: Chronically ill,  disheveled, in mild resp distress on [place] but significantly improved on Bipap.   Body mass index is 19.68 kg/m. Wt Readings from Last 3 Encounters:  03/23/17 57 kg (125 lb 10.6 oz)  09/09/15 61.7 kg (136 lb)    HEENT: PERLA, anicteric sclerae. (-) Oral thrush. Comfortable on BiPAP.  Neck: No masses. Midline trachea. No JVD, (-) LAD. (-) bruits appreciated.  Respiratory/Chest: Grossly normal chest. (-) deformity. (-) Accessory muscle use.  Symmetric expansion. Diminished BS on both lower lung zones. Wheezing in both lungs, left more than right. (-)  crackles, rhonchi (-) egophony  Cardiovascular: Regular rate and  rhythm, heart sounds normal, no murmur or gallops,  Trace peripheral edema  Gastrointestinal:  Normal bowel sounds. Soft, non-tender. No hepatosplenomegaly.  (-) masses.   Musculoskeletal:  Normal muscle tone.   Extremities: Grossly normal. (-) clubbing, cyanosis.  (-) edema  Skin: (-) rash,lesions seen.   Neurological/Psychiatric : sedated, intubated. CN grossly intact. (-) lateralizing signs.   CBC Recent Labs     03/21/17  0913  03/22/17  0953  03/23/17  0445  WBC  9.9  18.3*  16.0*  HGB  13.0  13.0  12.3*  HCT  39.6  40.2  39.1  PLT  276  313  308    Coag's No results for input(s): APTT, INR in the last 72 hours.  BMET Recent Labs     03/22/17  0953  03/22/17  1637  03/23/17  0445  NA  140  140  140  K  3.4*  3.9  4.3  CL  105  107  107  CO2  22  25  28   BUN  12  10  14   CREATININE  0.89  0.77  0.94  GLUCOSE  154*  192*  170*    Electrolytes Recent Labs     03/22/17  0953  03/22/17  1637  03/23/17  0445  CALCIUM  8.6*  8.6*  8.6*  MG   --   3.0*   --     Sepsis Markers No results for input(s): PROCALCITON, O2SATVEN in the last 72 hours.  Invalid input(s): LACTICACIDVEN  ABG Recent Labs     03/22/17  2320  PHART  7.382  PCO2ART  46.7  PO2ART  67.1*    Liver Enzymes Recent Labs     03/21/17  0913  AST  21  ALT  11*   ALKPHOS  64  BILITOT  0.7  ALBUMIN  3.1*    Cardiac Enzymes Recent Labs     03/21/17  0915  03/22/17  1637  TROPONINI  <0.03  <0.03    Glucose No results for input(s): GLUCAP in the last 72 hours.  Imaging No results found.   Assessment/Plan: Acute hypoxemic hypercapnic respiratory failure secondary to acute exacerbation of COPD probably 2/2 PNA vs Pneumonitis in RML. - Patient had a PET CT scan in February 2018 which showed an infiltrate in the right middle lobe. During this admission, he had a chest CT scan which showed more prominent right middle lobe infiltrate. - Continue BiPAP. He is improved on bipap.  - At risk for intubation. - Start Pulmicort 0.5 mg twice a day + Atrovent QID + Xopenex QID - Cont IV steroids - On zosyn, vanc. azithro >> consider de-escalating to Unasyn for possible pneumonia versus pneumonitis.  - Aspiration precautions.   Polysubstance abuse/withdrawal (ETOH + Heroine + Cocaine) - Continue Precedex drip ( he seems to  Like precedex drip) - Continue CIWA protocol, benzos prn - cont Fentanyl prn    I spent  30 minutes of Critical Care time with this patient today. This is my time spent independent of the APP or resident.   Family :Family updated at length today.  I called the patient's mother and updated her of patient's condition. She is aware the patient is very sick and may end up being intubated. According to the mother, he does not have any children.  Pollie Meyer, MD 03/23/2017, 2:23 PM [facility] Pager [phone] After 3 pm or if no answer, call [phone]

## 2017-03-23 NOTE — Progress Notes (Signed)
PROGRESS NOTE    Lawrence Hanna  WUJ:[id number] DOB: 02/09/1971 DOA: 03/21/2017 PCP: Patient, No Pcp Per  Brief Narrative:  Lawrence Hanna  is a 46 y.o. male, With history of IV drug use, no previously known medical problems except COPD who came to [facility] to get heroin detox, he was in the cycle holding area for the last 1 day, last night around 3 AM he started developing shortness of breath along with productive cough and fevers. This morning he went into acute hypoxic respiratory failure requiring BiPAP, further workup which included blood work and CT scan chest revealed pneumonia and I was called to admit the patient. Pulmonary critical care was consulted by ER physician who recommended patient to be admitted to stepdown by hospitalist and they have nothing to offer at this time. He is currently on heroin withdrawal protocol.   Assessment & Plan:   Principal Problem:   Acute hypercapnic respiratory failure (HCC) Active Problems:   CAP (community acquired pneumonia)  1-Acute hypoxic Respiratory Failure; in setting of aspiration PNA.  Refuse BIPAP.  Patient using Accessory muscle to breath, sat 94 % on 5 L. High risk for requiring intubation.  CCM Consulted.  Continue with nebulizer, IV solumedrol.  IV vancomycin and zosyn.  Sputum culture pending.   2-Drugs withdrawal, received clonidine, bentyl. Few doses overnight of ativan. He is very restless, distress.  IV ativan 1 mg every @ hours ordered.  CCM consulted. Might need intubation.   3-COPD, exacerbation;  IV solumedrol, nebulizer.   4-SVT; started on oral metoprolol.  IV metoprolol PRN.   5-Suicidal; continue with sitter. Will need psych evaluation when stable.      DVT prophylaxis: heparin  Code Status: full code.  Family Communication: none at bedside.  Disposition Plan: remain ICU  Consultants:   CCM   Procedures: none   Antimicrobials:   Vancomycin  Zosyn    Subjective: Restless, in respiratory  distress.    Objective: Vitals:   03/23/17 0300 03/23/17 0400 03/23/17 0500 03/23/17 0600  BP: 135/84 (!) 184/87 (!) 144/77 (!) 154/97  Pulse: (!) 31 92 97 84  Resp: (!) 30 (!) 36 (!) 28 (!) 26  Temp:      TempSrc:      SpO2: (!) 89% 99% 98% 98%  Weight:   57 kg (125 lb 10.6 oz)   Height:        Intake/Output Summary (Last 24 hours) at 03/23/17 0731 Last data filed at 03/23/17 0647  Gross per 24 hour  Intake          4193.33 ml  Output             2525 ml  Net          1668.33 ml   Filed Weights   03/22/17 1200 03/23/17 0500  Weight: 57 kg (125 lb 10.6 oz) 57 kg (125 lb 10.6 oz)    Examination:  General exam: restless. Diaphoretic.  Respiratory system: using accessory muscle to breath, Bilateral wheezing, ronchus.  Cardiovascular system: S1 & S2 heard, RRR. No JVD, murmurs, rubs, gallops or clicks. No pedal edema. Gastrointestinal system: Abdomen is nondistended, soft and nontender. No organomegaly or masses felt. Normal bowel sounds heard. Central nervous system: Alert and oriented. No focal neurological deficits. Extremities: Symmetric 5 x 5 power. Skin: No rashes, lesions or ulcers Psychiatry: unable to evaluate     Data Reviewed: I have personally reviewed following labs and imaging studies  CBC:  Recent Labs Lab  03/21/17 0913 03/22/17 0953 03/23/17 0445  WBC 9.9 18.3* 16.0*  NEUTROABS 9.1*  --   --   HGB 13.0 13.0 12.3*  HCT 39.6 40.2 39.1  MCV 90.2 90.5 91.8  PLT 276 313 308   Basic Metabolic Panel:  Recent Labs Lab 03/21/17 0913 03/22/17 0953 03/22/17 1637 03/23/17 0445  NA 136 140 140 140  K 3.4* 3.4* 3.9 4.3  CL 104 105 107 107  CO2 25 22 25 28   GLUCOSE 157* 154* 192* 170*  BUN 9 12 10 14   CREATININE 0.87 0.89 0.77 0.94  CALCIUM 8.5* 8.6* 8.6* 8.6*  MG  --   --  3.0*  --    GFR: Estimated Creatinine Clearance: 78.3 mL/min (by C-G formula based on SCr of 0.94 mg/dL). Liver Function Tests:  Recent Labs Lab 03/21/17 0913  AST 21    ALT 11*  ALKPHOS 64  BILITOT 0.7  PROT 6.4*  ALBUMIN 3.1*   No results for input(s): LIPASE, AMYLASE in the last 168 hours. No results for input(s): AMMONIA in the last 168 hours. Coagulation Profile: No results for input(s): INR, PROTIME in the last 168 hours. Cardiac Enzymes:  Recent Labs Lab 03/21/17 0915 03/22/17 1637  TROPONINI <0.03 <0.03   BNP (last 3 results) No results for input(s): PROBNP in the last 8760 hours. HbA1C: No results for input(s): HGBA1C in the last 72 hours. CBG: No results for input(s): GLUCAP in the last 168 hours. Lipid Profile: No results for input(s): CHOL, HDL, LDLCALC, TRIG, CHOLHDL, LDLDIRECT in the last 72 hours. Thyroid Function Tests: No results for input(s): TSH, T4TOTAL, FREET4, T3FREE, THYROIDAB in the last 72 hours. Anemia Panel: No results for input(s): VITAMINB12, FOLATE, FERRITIN, TIBC, IRON, RETICCTPCT in the last 72 hours. Sepsis Labs:  Recent Labs Lab 03/22/17 1242 03/22/17 1740 03/22/17 2000  LATICACIDVEN 6.7* 1.1 0.6    Recent Results (from the past 240 hour(s))  MRSA PCR Screening     Status: None   Collection Time: 03/22/17 12:00 PM  Result Value Ref Range Status   MRSA by PCR NEGATIVE NEGATIVE Final    Comment:        The GeneXpert MRSA Assay (FDA approved for NASAL specimens only), is one component of a comprehensive MRSA colonization surveillance program. It is not intended to diagnose MRSA infection nor to guide or monitor treatment for MRSA infections.   Culture, expectorated sputum-assessment     Status: None   Collection Time: 03/22/17  1:46 PM  Result Value Ref Range Status   Specimen Description SPU  Final   Special Requests NONE  Final   Sputum evaluation THIS SPECIMEN IS ACCEPTABLE FOR SPUTUM CULTURE  Final   Report Status 03/22/2017 FINAL  Final  Culture, respiratory (NON-Expectorated)     Status: None (Preliminary result)   Collection Time: 03/22/17  1:46 PM  Result Value Ref Range Status    Specimen Description SPU  Final   Special Requests NONE Reflexed from [id number]  Final   Gram Stain   Final    RARE WBC PRESENT, PREDOMINANTLY PMN FEW GRAM POSITIVE COCCI IN PAIRS FEW GRAM NEGATIVE RODS RARE GRAM POSITIVE RODS RARE GRAM NEGATIVE DIPLOCOCCI Performed at [facility], [address] [place], [place] [address]    Culture PENDING  Incomplete   Report Status PENDING  Incomplete         Radiology Studies: Dg Chest 2 View  Result Date: 03/21/2017 CLINICAL DATA:  Shortness of breath, cough, congestion EXAM: CHEST  2 VIEW COMPARISON:  None. FINDINGS: Lungs are clear.  No pleural effusion or pneumothorax. The heart is normal in size. Visualized osseous structures are within normal limits. IMPRESSION: Normal chest radiograph. Electronically Signed   By: Charline Bills M.D.   On: 03/21/2017 08:52   Ct Angio Chest Pe W Or Wo Contrast  Result Date: 03/21/2017 CLINICAL DATA:  Shortness of breath, cough EXAM: CT ANGIOGRAPHY CHEST WITH CONTRAST TECHNIQUE: Multidetector CT imaging of the chest was performed using the standard protocol during bolus administration of intravenous contrast. Multiplanar CT image reconstructions and MIPs were obtained to evaluate the vascular anatomy. CONTRAST:  100 mL Isovue 370 IV COMPARISON:  Chest radiographs dated 03/21/2017. FINDINGS: Cardiovascular: Satisfactory opacification the bilateral pulmonary artery to the segmental level. Evaluation is mildly constrained due to respiratory motion. No evidence pulmonary embolism. The heart is normal in size.  No pericardial effusion. No evidence of thoracic aortic aneurysm. Mediastinum/Nodes: No suspicious mediastinal lymphadenopathy. Visualized thyroid is unremarkable. Lungs/Pleura: Evaluation of the lung parenchyma is constrained by respiratory motion. Mild centrilobular and paraseptal emphysematous changes, upper lobe predominant. Very mild patchy opacity in the medial right middle lobe (series 7/ image  66), likely infectious. Minimal left basilar opacity, likely atelectasis. No suspicious pulmonary nodules. No pleural effusion or pneumothorax. Upper Abdomen: Visualized upper abdomen is unremarkable. Musculoskeletal: Visualized osseous structures are within normal limits. Review of the MIP images confirms the above findings. IMPRESSION: No evidence pulmonary embolism. Very mild patchy opacity in the medial right middle lobe, likely reflecting mild infection. Electronically Signed   By: Charline Bills M.D.   On: 03/21/2017 10:45        Scheduled Meds: . albuterol  10 mg/hr Nebulization Once  . aspirin  81 mg Oral Daily  . chlorhexidine  15 mL Mouth Rinse BID  . Chlorhexidine Gluconate Cloth  6 each Topical Daily  . cloNIDine  0.1 mg Oral QID   Followed by  . cloNIDine  0.1 mg Oral BH-qamhs   Followed by  . [START ON 03/26/2017] cloNIDine  0.1 mg Oral QAC breakfast  . heparin  5,000 Units Subcutaneous Q8H  . ipratropium  1 mg Nebulization Once  . ipratropium-albuterol  3 mL Nebulization Q6H  . LORazepam  1 mg Intravenous STAT  . mouth rinse  15 mL Mouth Rinse q12n4p  . methylPREDNISolone (SOLU-MEDROL) injection  60 mg Intravenous TID  . metoprolol tartrate  50 mg Oral BID  . nicotine  21 mg Transdermal Daily  . sodium chloride flush  3 mL Intravenous Q12H  . traZODone  50 mg Oral QHS   Continuous Infusions: . sodium chloride 125 mL/hr at 03/23/17 0600  . azithromycin Stopped (03/22/17 1654)  . piperacillin-tazobactam (ZOSYN)  IV 3.375 g (03/23/17 0647)  . vancomycin Stopped (03/22/17 2225)     LOS: 1 day    Time spent: 35 minutes.     Alba Cory, MD [facility] Pager [phone]  If 7PM-7AM, please contact night-coverage [email or web address] Password [facility] 03/23/2017, 7:31 AM

## 2017-03-23 NOTE — Progress Notes (Signed)
Pt agitated and noncompliant with treatments earlier in shift. ELink notified and pt's request for food given with regular diet. Pt willing to take meds as ordered but will not wear bipap.  VSS, other than elevated RR. Precedex gtt at 0.6 due to cardiac effect. Will continue to monitor.

## 2017-03-23 NOTE — Consult Note (Signed)
Cardiology Consult    Patient ID: Lawrence Hanna MRN: [id number], DOB/AGE: 12-10-1970   Admit date: 03/21/2017 Date of Consult: 03/23/2017  Primary Physician: Patient, No Pcp Per Primary Cardiologist: new - Dr. Jens Som Requesting Provider: Dr. Sunnie Nielsen  Reason for Consult: chest pain  Patient Profile    Ms. Lawrence Hanna has a PMH significant for COPD and drug use (heroin and cocaine). He reported to the ED (03/21/17) with shortness of breath and requested detox.   [name] is a 46 y.o. male who is being seen today for the evaluation of SVT at the request of Dr. Sunnie Nielsen.    Past Medical History   Past Medical History:  Diagnosis Date  . COPD (chronic obstructive pulmonary disease) (HCC)   . Renal disorder     History reviewed. No pertinent surgical history.   Allergies  Allergies  Allergen Reactions  . Codeine Nausea Only    History of Present Illness    History gathered from documentation in EPIC; patient is acutely short of breath and is not answering questions fully. Level 5 Caveat.  Lawrence Hanna was admitted to internal medicine from ED for tachycardia, shortness of breath in the setting of COPD, found to have PNA on CTA, and UDS positive for opiates and cocaine. He remained in [facility] psych holding area for 1 day, but then developed wheezing and acute respiratory failure requiring BIPAP. He was admitted to hospitalist service to step down unit. On 03/22/17, he developed SVT with aberrancy, which resolved with IV lopressor. PO lopressor ordered. On my interview, he denies chest pain, but reports palpitations, shortness of breath, and nausea. He is acutely short of breath with increased work of breathing.    Inpatient Medications    . albuterol  10 mg/hr Nebulization Once  . aspirin  81 mg Oral Daily  . budesonide (PULMICORT) nebulizer solution  0.5 mg Nebulization BID  . chlorhexidine  15 mL Mouth Rinse BID  . Chlorhexidine Gluconate Cloth  6 each Topical Daily  . cloNIDine  0.1  mg Oral QID   Followed by  . cloNIDine  0.1 mg Oral BH-qamhs   Followed by  . [START ON 03/26/2017] cloNIDine  0.1 mg Oral QAC breakfast  . fentaNYL      . fentaNYL (SUBLIMAZE) injection  100 mcg Intravenous Once  . folic acid  1 mg Intravenous Daily  . heparin  5,000 Units Subcutaneous Q8H  . ipratropium-albuterol  3 mL Nebulization Q6H  . LORazepam      . mouth rinse  15 mL Mouth Rinse q12n4p  . methylPREDNISolone (SOLU-MEDROL) injection  60 mg Intravenous Q8H  . metoprolol tartrate  50 mg Oral BID  . nicotine  21 mg Transdermal Daily  . sodium chloride flush  3 mL Intravenous Q12H  . thiamine injection  100 mg Intravenous Daily     Outpatient Medications    Prior to Admission medications   Not on File     Family History     Family History  Problem Relation Age of Onset  . Hypertension Father     Social History    Social History   Social History  . Marital status: Single    Spouse name: N/A  . Number of children: N/A  . Years of education: N/A   Occupational History  . Not on file.   Social History Main Topics  . Smoking status: Current Every Day Smoker    Types: Cigarettes  . Smokeless tobacco: Never Used  . Alcohol use  No  . Drug use: Unknown  . Sexual activity: Not on file   Other Topics Concern  . Not on file   Social History Narrative  . No narrative on file     Review of Systems    General:  No chills, fever, night sweats or weight changes.  Cardiovascular:  No chest pain, dyspnea on exertion, edema, orthopnea, + palpitations, no paroxysmal nocturnal dyspnea. Dermatological: No rash, lesions/masses Respiratory: No cough, + dyspnea Urologic: No hematuria, dysuria Abdominal:   No nausea, vomiting, diarrhea, bright red blood per rectum, melena, or hematemesis Neurologic:  No visual changes, changes in mental status. All other systems reviewed and are otherwise negative except as noted above.  Physical Exam    Blood pressure (!) 158/95,  pulse (!) 102, temperature 98 F (36.7 C), temperature source Oral, resp. rate 15, height 5\' 7"  (1.702 m), weight 125 lb 10.6 oz (57 kg), SpO2 96 %.  General: Pleasant, NAD Psych: Normal affect. Neuro: Alert and oriented X 3. Moves all extremities spontaneously. HEENT: Normal  Neck: Supple without bruits or JVD. Lungs:  Respirations labored, using accessory muscles, coarse sounds throughout, unable to rest supine Heart: RRR no s3, s4, or murmurs, exam difficult given breath sounds Abdomen: Soft, non-tender, non-distended, BS + x 4.  Extremities: No clubbing, cyanosis or edema. DP/PT/Radials 2+ and equal bilaterally.  Labs    Troponin (Point of Care Test) No results for input(s): TROPIPOC in the last 72 hours.  Recent Labs  03/21/17 0915 03/22/17 1637  TROPONINI <0.03 <0.03   Lab Results  Component Value Date   WBC 16.0 (H) 03/23/2017   HGB 12.3 (L) 03/23/2017   HCT 39.1 03/23/2017   MCV 91.8 03/23/2017   PLT 308 03/23/2017    Recent Labs Lab 03/21/17 0913  03/23/17 0445  NA 136  < > 140  K 3.4*  < > 4.3  CL 104  < > 107  CO2 25  < > 28  BUN 9  < > 14  CREATININE 0.87  < > 0.94  CALCIUM 8.5*  < > 8.6*  PROT 6.4*  --   --   BILITOT 0.7  --   --   ALKPHOS 64  --   --   ALT 11*  --   --   AST 21  --   --   GLUCOSE 157*  < > 170*  < > = values in this interval not displayed. No results found for: CHOL, HDL, LDLCALC, TRIG Lab Results  Component Value Date   DDIMER 1.83 (H) 03/21/2017     Radiology Studies    Dg Chest 2 View  Result Date: 03/21/2017 CLINICAL DATA:  Shortness of breath, cough, congestion EXAM: CHEST  2 VIEW COMPARISON:  None. FINDINGS: Lungs are clear.  No pleural effusion or pneumothorax. The heart is normal in size. Visualized osseous structures are within normal limits. IMPRESSION: Normal chest radiograph. Electronically Signed   By: Charline Bills M.D.   On: 03/21/2017 08:52   Ct Angio Chest Pe W Or Wo Contrast  Result Date:  03/21/2017 CLINICAL DATA:  Shortness of breath, cough EXAM: CT ANGIOGRAPHY CHEST WITH CONTRAST TECHNIQUE: Multidetector CT imaging of the chest was performed using the standard protocol during bolus administration of intravenous contrast. Multiplanar CT image reconstructions and MIPs were obtained to evaluate the vascular anatomy. CONTRAST:  100 mL Isovue 370 IV COMPARISON:  Chest radiographs dated 03/21/2017. FINDINGS: Cardiovascular: Satisfactory opacification the bilateral pulmonary artery to the segmental level.  Evaluation is mildly constrained due to respiratory motion. No evidence pulmonary embolism. The heart is normal in size.  No pericardial effusion. No evidence of thoracic aortic aneurysm. Mediastinum/Nodes: No suspicious mediastinal lymphadenopathy. Visualized thyroid is unremarkable. Lungs/Pleura: Evaluation of the lung parenchyma is constrained by respiratory motion. Mild centrilobular and paraseptal emphysematous changes, upper lobe predominant. Very mild patchy opacity in the medial right middle lobe (series 7/ image 66), likely infectious. Minimal left basilar opacity, likely atelectasis. No suspicious pulmonary nodules. No pleural effusion or pneumothorax. Upper Abdomen: Visualized upper abdomen is unremarkable. Musculoskeletal: Visualized osseous structures are within normal limits. Review of the MIP images confirms the above findings. IMPRESSION: No evidence pulmonary embolism. Very mild patchy opacity in the medial right middle lobe, likely reflecting mild infection. Electronically Signed   By: Charline BillsSriyesh  Krishnan M.D.   On: 03/21/2017 10:45    ECG & Cardiac Imaging    EKG 03/23/17: NSR  EKG with tachycardia and premature complexes  Echocardiogram 03/23/17: pending  Assessment & Plan    1. SVT Patient had atrial tachycardia for approximately 40 minutes on 03/22/17 treated with 5 mg IV lopressor OTO, which resolved the tachycardia. No further episodes. Agree with 50 mg PO lopressor BID.  This tachycardia may be related to his withdrawal process a/o respiratory failure. Currently in sinus rhythm in the 100s. UDS positive for cocaine, beta blocker may not be the best choice. May consider cardizem for rate control.    2. Acute hypoxia and wheezing - primary team treating for pneumonia - respiratory therapy and albuterol, pulmicort, and duo-nebs - would recommend switching albuterol to xopenex, which is less likely to cause tachycardia   3. Drug abuse - per primary team, CIWA protocol / clonidine taper - UDS positive for opiates and cocaine   Signed, Marcelino Dusterngela Nicole Duke, PA-C 03/23/2017, 8:54 AM [phone]  As above, patient seen and examined. Briefly he is a 46 year old male with past medical history of COPD, drug use including cocaine and heroin who I'm asked to evaluate for supraventricular tachycardia at the request of Dr Sunnie Nielsenegalado. Patient presented to the emergency room for detoxification and also noted to be short of breath. He has known COPD and also possible pneumonia. His drug screen is positive for opiates and cocaine. At approximate 6 PM yesterday evening he was noted to develop probable atrial tachycardia with 2-1 conduction followed by 1-1 conduction with rate related bundle branch block. He was given metoprolol and has remained in sinus since. Cardiology now asked to evaluate. Patient is presently on Precedex and BiPAP and therefore history is not obtainable. He moves his extremities but does not respond to questions.  Today's electrocardiogram shows sinus rhythm with no ST changes. Electrocardiogram at time of arrhythmia showed probable atrial tachycardia with aberrant conduction and rate related bundle.  1 ectopic atrial tachycardia-I have personally reviewed the patient's rhythm strips and ECG. His rhythm strips show initial ectopic atrial tachycardia with 2-1 conduction. It did increase his to 1-1 conduction with aberrancy. He converted to sinus rhythm with  metoprolol. He is presently in sinus rhythm. I would like to avoid beta-blockade if possible given drug screen positive for cocaine. Patient could be treated with IV Cardizem as needed. If he has more frequent episodes could treat with IV Cardizem until he improves from a respiratory standpoint and detoxifies. Will check echocardiogram for LV function. Check TSH.  2 respiratory failure/pneumonia/COPD-patient presently on BiPAP. Management per pulmonary and primary care.  3 substance abuse-per primary care. Needs detoxification.  Arlys JohnBrian  Stanford Breed, MD

## 2017-03-23 NOTE — Progress Notes (Signed)
eLink Physician-Brief Progress Note Patient Name: Lawrence Hanna DOB: 08/25/1971 MRN: [id number]   Date of Service  03/23/2017  HPI/Events of Note  Camera check on patient with increasing agitation and noncompliance with treatments. Patient has remained off BiPAP for 5 hours. Breathing comfortably on nasal cannula laying fully recumbent with his hands behind his head. Multiple staff members at bedside. Continuing on Precedex infusion. Normal work of breathing.   eICU Interventions  1. Ordering regular diet 2. Continuing Precedex drip 3. ) Monitoring for worsening respiratory status      Intervention Category Major Interventions: Delirium, psychosis, severe agitation - evaluation and management  Lawanda CousinsJennings Nestor 03/23/2017, 9:04 PM

## 2017-03-23 NOTE — Progress Notes (Signed)
RN removed PT from BiPAP at approx 1555 and placed on 4 lpm Correll. Neb tx given.

## 2017-03-23 NOTE — Progress Notes (Signed)
eLink Physician-Brief Progress Note Patient Name: Leane ParaSteven Tull DOB: 10/25/1971 MRN: [id number]   Date of Service  03/23/2017  HPI/Events of Note  Bedside nurse reports patient wanting to take in oral medication and nutrition. Transitioned off BiPAP at 1555 per RT documentation to nasal cannula. Patient remains on Precedex infusion.   eICU Interventions  Patient to remain NPO for now with close monitoring.      Intervention Category Major Interventions: Respiratory failure - evaluation and management  Lawanda CousinsJennings Carel Carrier 03/23/2017, 5:05 PM

## 2017-03-23 NOTE — Progress Notes (Signed)
  Echocardiogram 2D Echocardiogram has been performed.  Lawrence Hanna, Lawrence Hanna 03/23/2017, 1:44 PM

## 2017-03-24 ENCOUNTER — Inpatient Hospital Stay (HOSPITAL_COMMUNITY): Payer: Medicaid Other

## 2017-03-24 DIAGNOSIS — J690 Pneumonitis due to inhalation of food and vomit: Principal | ICD-10-CM

## 2017-03-24 DIAGNOSIS — Z814 Family history of other substance abuse and dependence: Secondary | ICD-10-CM

## 2017-03-24 DIAGNOSIS — Z7951 Long term (current) use of inhaled steroids: Secondary | ICD-10-CM

## 2017-03-24 DIAGNOSIS — F17210 Nicotine dependence, cigarettes, uncomplicated: Secondary | ICD-10-CM

## 2017-03-24 DIAGNOSIS — N289 Disorder of kidney and ureter, unspecified: Secondary | ICD-10-CM

## 2017-03-24 DIAGNOSIS — J449 Chronic obstructive pulmonary disease, unspecified: Secondary | ICD-10-CM

## 2017-03-24 DIAGNOSIS — I471 Supraventricular tachycardia: Secondary | ICD-10-CM

## 2017-03-24 DIAGNOSIS — F419 Anxiety disorder, unspecified: Secondary | ICD-10-CM

## 2017-03-24 DIAGNOSIS — Z7982 Long term (current) use of aspirin: Secondary | ICD-10-CM

## 2017-03-24 DIAGNOSIS — Z818 Family history of other mental and behavioral disorders: Secondary | ICD-10-CM

## 2017-03-24 DIAGNOSIS — Z79899 Other long term (current) drug therapy: Secondary | ICD-10-CM

## 2017-03-24 DIAGNOSIS — R410 Disorientation, unspecified: Secondary | ICD-10-CM

## 2017-03-24 DIAGNOSIS — F1924 Other psychoactive substance dependence with psychoactive substance-induced mood disorder: Secondary | ICD-10-CM

## 2017-03-24 DIAGNOSIS — F329 Major depressive disorder, single episode, unspecified: Secondary | ICD-10-CM

## 2017-03-24 DIAGNOSIS — Z886 Allergy status to analgesic agent status: Secondary | ICD-10-CM

## 2017-03-24 LAB — BASIC METABOLIC PANEL
Anion gap: 6 (ref 5–15)
BUN: 14 mg/dL (ref 6–20)
CHLORIDE: 105 mmol/L (ref 101–111)
CO2: 29 mmol/L (ref 22–32)
CREATININE: 0.94 mg/dL (ref 0.61–1.24)
Calcium: 8.8 mg/dL — ABNORMAL LOW (ref 8.9–10.3)
GFR calc non Af Amer: >60 mL/min (ref >60)
Glucose, Bld: 165 mg/dL — ABNORMAL HIGH (ref 65–99)
Potassium: 4.4 mmol/L (ref 3.5–5.1)
Sodium: 140 mmol/L (ref 135–145)

## 2017-03-24 LAB — CBC
HEMATOCRIT: 39.6 % (ref 39.0–52.0)
HEMOGLOBIN: 12.5 g/dL — AB (ref 13.0–17.0)
MCH: 28.8 pg (ref 26.0–34.0)
MCHC: 31.6 g/dL (ref 30.0–36.0)
MCV: 91.2 fL (ref 78.0–100.0)
Platelets: 298 10*3/uL (ref 150–400)
RBC: 4.34 MIL/uL (ref 4.22–5.81)
RDW: 14 % (ref 11.5–15.5)
WBC: 11.5 10*3/uL — ABNORMAL HIGH (ref 4.0–10.5)

## 2017-03-24 LAB — CULTURE, RESPIRATORY W GRAM STAIN: Culture: NORMAL

## 2017-03-24 LAB — CULTURE, RESPIRATORY

## 2017-03-24 LAB — PROCALCITONIN: Procalcitonin: <0.1 ng/mL

## 2017-03-24 MED ORDER — METHYLPREDNISOLONE SODIUM SUCC 40 MG IJ SOLR
30.0000 mg | Freq: Three times a day (TID) | INTRAMUSCULAR | Status: DC
  Administered 2017-03-24 – 2017-03-25 (×3): 30 mg via INTRAVENOUS
  Filled 2017-03-24 (×3): qty 1

## 2017-03-24 MED ORDER — LORAZEPAM 2 MG/ML IJ SOLN
1.0000 mg | INTRAMUSCULAR | Status: DC | PRN
  Administered 2017-03-24: 4 mg via INTRAVENOUS
  Administered 2017-03-24 (×2): 2 mg via INTRAVENOUS
  Administered 2017-03-24 (×2): 4 mg via INTRAVENOUS
  Filled 2017-03-24: qty 2
  Filled 2017-03-24 (×2): qty 1
  Filled 2017-03-24 (×2): qty 2

## 2017-03-24 MED ORDER — HALOPERIDOL LACTATE 5 MG/ML IJ SOLN
2.0000 mg | Freq: Four times a day (QID) | INTRAMUSCULAR | Status: DC | PRN
  Administered 2017-03-24 (×3): 2 mg via INTRAVENOUS
  Filled 2017-03-24 (×3): qty 1

## 2017-03-24 MED ORDER — FENTANYL CITRATE (PF) 100 MCG/2ML IJ SOLN: 25.0000 ug | INTRAMUSCULAR | Status: DC | PRN

## 2017-03-24 NOTE — Progress Notes (Signed)
Patient resting comfortably on 3L nasal cannula at this time.  Scheduled nebulizer treatment given.  Bipap currently not indicated at this time.  Will continue to monitor.

## 2017-03-24 NOTE — Progress Notes (Signed)
Pt c/o intermittent LEFT sided CP described as "stabbing" in nature. No SOB, dizziness, tingling/numbness, N/V, or any other associated sx. HR high 50s low 60s. All other VSS. Pt demanding Fentanyl. Spoke with Dr. Butler Denmarkizwan regarding CP; no orders at this time.

## 2017-03-24 NOTE — Progress Notes (Signed)
0015 pt became increasingly agitated and noncompliant, removed telemetry leads, [place], other equipment and refused to put them back on. Pt RR 40's with audible rhonchi and expiratory wheezes, increased SOB. Refused breathing tx earlier in evening as well. Notified ELink and received orders for CIWA scale and ativan per scale, as well as prn Haldol. Gave 2mg  of Haldol and 4mg  of Ativan. Pt still agitated but willing to wear bipap, but not telemetry. Received orders to restrain pt if needed. Sitter at bedside. Will attempt to put leads back on pt and monitor.

## 2017-03-24 NOTE — Progress Notes (Signed)
Pt back on telemetry. Resting comfortably. Will continue to monitor.

## 2017-03-24 NOTE — Progress Notes (Signed)
PULMONARY / CRITICAL CARE MEDICINE   Name: Lawrence Hanna MRN: [id number] DOB: 1971/06/16    ADMISSION DATE:  03/21/2017 CONSULTATION DATE:  03/23/17  REFERRING MD:  Dr. Sunnie Nielsen   CHIEF COMPLAINT:  Agitated delirium, possible aspiration PNA   HISTORY OF PRESENT ILLNESS:  46 y/o M with PMH of tobacco abuse / COPD (followed by Dr. Blenda Nicely) who presented to [facility] on 5/27 with complaints of shortness of breath, cough, suicidal ideations and detox request.    The patient remained in the PSY holding area for approximately 24 hours.  During that time, documentation shows increasing agitation and shortness of breath.  He was seen by [facility] and admitted for evaluation of respiratory symptoms.  The patients UDS was positive for opiates and cocaine.  ETOH was negative on admit.  Due to shortness of breath, a CTA of the chest was obtained which ruled out PE but showed very mild patchy opacity in the medial RML.  He was started on the Clonidine withdrawal protocol.  He became progressively more agitated and there were concerns for possible withdrawal.  He was treated with vancomycin / zosyn for possible PNA.  ABG 5/28 > 7.382 / 46 / 67 / 27.    PCCM consulted 5/29 for evaluation.    The patient reports he typically uses 1-2 gm of heroin per day.  He states he does not drink heavy > typically a "couple of beers per day".  He also uses cocaine.      SUBJECTIVE:   Staff report pt on 3L, respiratory status significantly improved. Off BiPAP.  He reportedly got violent with staff last evening.   VITAL SIGNS: BP (!) 166/98   Pulse (!) 50   Temp (!) 96.8 F (36 C) (Axillary)   Resp (!) 28   Ht 5\' 7"  (1.702 m)   Wt 134 lb 4.2 oz (60.9 kg)   SpO2 98%   BMI 21.03 kg/m   HEMODYNAMICS:    VENTILATOR SETTINGS: FiO2 (%):  [40 %] 40 %  INTAKE / OUTPUT: I/O last 3 completed shifts: In: 5914.5 [P.O.:610; I.V.:4354.5; IV Piggyback:950] Out: 4425 [Urine:4425]  PHYSICAL EXAMINATION: General:  Thin adult male in  NAD, resting in bed HEENT: MM pink/dry, no jvd PSY: calm / slightly sedate Neuro: Awakens to voice, appropriate, follows commands  CV: s1s2 rrr, no m/r/g PULM: even/non-labored, lungs bilaterally coarse, wheezing reduced  NW:GNFA, non-tender, bsx4 active  Extremities: warm/dry, no edema  Skin: no rashes or lesions, tattoos    LABS:  BMET  Recent Labs Lab 03/22/17 1637 03/23/17 0445 03/24/17 0443  NA 140 140 140  K 3.9 4.3 4.4  CL 107 107 105  CO2 25 28 29   BUN 10 14 14   CREATININE 0.77 0.94 0.94  GLUCOSE 192* 170* 165*    Electrolytes  Recent Labs Lab 03/22/17 1637 03/23/17 0445 03/24/17 0443  CALCIUM 8.6* 8.6* 8.8*  MG 3.0*  --   --     CBC  Recent Labs Lab 03/22/17 0953 03/23/17 0445 03/24/17 0443  WBC 18.3* 16.0* 11.5*  HGB 13.0 12.3* 12.5*  HCT 40.2 39.1 39.6  PLT 313 308 298    Coag's No results for input(s): APTT, INR in the last 168 hours.  Sepsis Markers  Recent Labs Lab 03/22/17 1242 03/22/17 1740 03/22/17 2000  LATICACIDVEN 6.7* 1.1 0.6    ABG  Recent Labs Lab 03/22/17 2320  PHART 7.382  PCO2ART 46.7  PO2ART 67.1*    Liver Enzymes  Recent Labs Lab 03/21/17 0913  AST 21  ALT 11*  ALKPHOS 64  BILITOT 0.7  ALBUMIN 3.1*    Cardiac Enzymes  Recent Labs Lab 03/21/17 0915 03/22/17 1637  TROPONINI <0.03 <0.03    Glucose No results for input(s): GLUCAP in the last 168 hours.  Imaging Dg Chest Port 1 View  Result Date: 03/24/2017 CLINICAL DATA:  Respiratory failure EXAM: PORTABLE CHEST 1 VIEW COMPARISON:  Mar 21, 2017 chest radiograph and chest CT FINDINGS: A small portion of the lateral left base is not visualized. Central catheter tip is in the superior vena cava near the cavoatrial junction. No pneumothorax. No edema or consolidation. Heart is mildly enlarged with pulmonary vascularity within normal limits. No adenopathy. No bone lesions. IMPRESSION: No edema or consolidation. Mild cardiac prominence. Central  catheter as described without evident pneumothorax. Electronically Signed   By: Bretta Bang III M.D.   On: 03/24/2017 07:17     STUDIES:  CTA Chest 5/27 >> neg for PE, very mild patchy opacity in the RML  CULTURES: BCx2 5/28 >>  Sputum 5/28 >>   ANTIBIOTICS: Vanco 5/28 >>  Zosyn 5/28 >>  Azithro 5/28 >>   SIGNIFICANT EVENTS: 5/27  Presented to [facility] with suicidal ideations, request for detox  5/29  PCCM consulted for respiratory distress, ? Withdrawal   LINES/TUBES:   DISCUSSION: 46 y/o M admitted on 5/27 for drug detox and suicidal ideations.  Developed progressive agitation / delirium and respiratory distress.  PCCM consulted on 5/29 for increased work of breathing, possible withdrawal.    ASSESSMENT / PLAN:  PULMONARY A: Acute Respiratory Distress  RML Airspace Disease - seen in 11/2016 as well, slightly increased COPD with Acute Exacerbation  Hx Pulmonary Nodules - seen on PET in 11/2016, not hypermetabolic P:   PRN BiPAP for increased WOB  Monitor closely for changes in respiratory status  Duoneb Q6  Pulmicort BID  Solumedrol 60 mg IV Q8 Intermittent CXR  Nicotine patch  Avoid oversedation   INFECTIOUS A:   Leukocytosis RML Airspace Disease - slightly increased from prior PET P:   ABX / Cultures as above Monitor fever curve, WBC trend Consider narrowing abx > defer to primary   NEUROLOGIC A:   Agitated Delirium - ? Narcotic withdrawal, ETOH negative on admit, UDS positive for cocaine, opiates.  Suicidal Ideation  Cocaine + Heroin Abuse  P:   RASS goal: 0 Thiamine, folate, MVI  PRN fentanyl for pain / opiate hx Precedex gtt, attempt to wean off  PRN ativan  Safety sitter at bedside for suicidal ideation    FAMILY  - Updates:  No family at bedside 5/30.    - Inter-disciplinary family meet or Palliative Care meeting due by:  6/5  CC Time: 30 minutes   Canary Brim, NP-C [facility] Pgr: [phone] or if no answer  [phone] 03/24/2017, 9:27 AM  ATTENDING NOTE / ATTESTATION NOTE :   I have discussed the case with the resident/APP  Canary Brim NP  I agree with the resident/APP's  history, physical examination, assessment, and plans.    I have edited the above note and modified it according to our agreed history, physical examination, assessment and plan.   Briefly, 46 y/o M with PMH of tobacco abuse / COPD (followed by Dr. Blenda Nicely) who presented to [facility] on 5/27 with complaints of shortness of breath, cough, suicidal ideations and detox request.    The patient remained in the PSY holding area for approximately 24 hours.  During that time,  documentation shows increasing agitation and shortness of breath.  He was seen by [facility] and admitted for evaluation of respiratory symptoms.  The patients UDS was positive for opiates and cocaine.  ETOH was negative on admit.  Due to shortness of breath, a CTA of the chest was obtained which ruled out PE but showed very mild patchy opacity in the medial RML.  He was started on the Clonidine withdrawal protocol.  He became progressively more agitated and there were concerns for possible withdrawal.  He was treated with vancomycin / zosyn for possible PNA.  ABG 5/28 > 7.382 / 46 / 67 / 27.    He was tachypneic on [place] on 5/29.  Significantly improved on BiPaP. Bipap has been weaned off.  He was agitated last night.  Better this am.  On precedex drip.   Vitals:  Vitals:   03/24/17 0752 03/24/17 0825 03/24/17 0827 03/24/17 0900  BP:    (!) 144/77  Pulse:    73  Resp:    (!) 31  Temp: (!) 96.8 F (36 C)     TempSrc: Axillary     SpO2:  98% 98% 92%  Weight:      Height:        Constitutional/General: well-nourished, well-developed,  not in any distress. Sleeping, arousable.   Body mass index is 21.03 kg/m. Wt Readings from Last 3 Encounters:  03/24/17 60.9 kg (134 lb 4.2 oz)  09/09/15 61.7 kg (136 lb)    HEENT: PERLA, anicteric sclerae. (-) Oral thrush.  Neck: No  masses. Midline trachea. No JVD, (-) LAD. (-) bruits appreciated.  Respiratory/Chest: Grossly normal chest. (-) deformity. (-) Accessory muscle use.  Symmetric expansion. Diminished BS on both lower lung zones. Less wheezing in upper lung zones (-) crackles, rhonchi (-) egophony  Cardiovascular: Regular rate and  rhythm, heart sounds normal, no murmur or gallops,  Trace peripheral edema  Gastrointestinal:  Normal bowel sounds. Soft, non-tender. No hepatosplenomegaly.  (-) masses.   Musculoskeletal:  Normal muscle tone.   Extremities: Grossly normal. (-) clubbing, cyanosis.  (-) edema  Skin: (-) rash,lesions seen.   Neurological/Psychiatric : sedated, intubated. CN grossly intact. (-) lateralizing signs.     CBC Recent Labs     03/22/17  0953  03/23/17  0445  03/24/17  0443  WBC  18.3*  16.0*  11.5*  HGB  13.0  12.3*  12.5*  HCT  40.2  39.1  39.6  PLT  313  308  298    Coag's No results for input(s): APTT, INR in the last 72 hours.  BMET Recent Labs     03/22/17  1637  03/23/17  0445  03/24/17  0443  NA  140  140  140  K  3.9  4.3  4.4  CL  107  107  105  CO2  25  28  29   BUN  10  14  14   CREATININE  0.77  0.94  0.94  GLUCOSE  192*  170*  165*    Electrolytes Recent Labs     03/22/17  1637  03/23/17  0445  03/24/17  0443  CALCIUM  8.6*  8.6*  8.8*  MG  3.0*   --    --     Sepsis Markers No results for input(s): PROCALCITON, O2SATVEN in the last 72 hours.  Invalid input(s): LACTICACIDVEN  ABG Recent Labs     03/22/17  2320  PHART  7.382  PCO2ART  46.7  PO2ART  67.1*    Liver Enzymes No results for input(s): AST, ALT, ALKPHOS, BILITOT, ALBUMIN in the last 72 hours.  Cardiac Enzymes Recent Labs     03/22/17  1637  TROPONINI  <0.03    Glucose No results for input(s): GLUCAP in the last 72 hours.  Imaging Dg Chest Port 1 View  Result Date: 03/24/2017 CLINICAL DATA:  Respiratory failure EXAM: PORTABLE CHEST 1 VIEW COMPARISON:   Mar 21, 2017 chest radiograph and chest CT FINDINGS: A small portion of the lateral left base is not visualized. Central catheter tip is in the superior vena cava near the cavoatrial junction. No pneumothorax. No edema or consolidation. Heart is mildly enlarged with pulmonary vascularity within normal limits. No adenopathy. No bone lesions. IMPRESSION: No edema or consolidation. Mild cardiac prominence. Central catheter as described without evident pneumothorax. Electronically Signed   By: Bretta BangWilliam  Woodruff III M.D.   On: 03/24/2017 07:17   Assessment/Plan: Acute hypoxemic hypercapnic respiratory failure secondary to acute exacerbation of COPD probably 2/2 PNA 9aspiration)  vs Pneumonitis in RML. - Patient had a PET CT scan in February 2018 which showed an infiltrate in the right middle lobe. During this admission, he had a chest CT scan which showed more prominent right middle lobe infiltrate. As he is improved and CXR is improved and cultures are (-), suggest switch abx to Unasyn and complete 5-7 days.  - will check PCT as well - Continue BiPAP prn for resp distress. - Cont Pulmicort 0.5 mg twice a day + Atrovent QID + Xopenex QID - Cont IV steroids >> will decrease dose from 60 to 30 mg q8 - Aspiration precautions.   Polysubstance abuse/withdrawal (ETOH + Heroine + Cocaine) - Continue Precedex drip ( he seems to  Like precedex drip). Taper down as he was sleeping this am.  - Continue CIWA protocol, benzos prn - cont Fentanyl prn    Family   No family at bedside.    Pollie MeyerJ. Angelo A de Dios, MD 03/24/2017, 10:40 AM [facility] Pager [phone] After 3 pm or if no answer, call [phone]

## 2017-03-24 NOTE — Consult Note (Signed)
[employer] Psychiatry Consult   Reason for Consult:  Suicidal ideation, polysubstance abuse Referring Physician:  ICU team Patient Identification: Lawrence Hanna MRN:  [id number] Principal Diagnosis: Acute hypercapnic respiratory failure [facility]) Diagnosis:   Patient Active Problem List   Diagnosis Date Noted  . Delirium [R41.0]   . Ectopic atrial tachycardia (HCC) [I47.1]   . Acute hypercapnic respiratory failure ([place]) [J96.02] 03/22/2017  . CAP (community acquired pneumonia) [J18.9] 03/22/2017  . COPD (chronic obstructive pulmonary disease) ([place]) [J44.9]   . Renal disorder [N28.9]     Total Time spent with patient: 20 minutes  Subjective:   Lawrence Hanna is a 46 y.o. male patient admitted with reports of polysubstance abuse, demanding narcotics, later decompensating into acute respiratory distress leading to admission as below. Psych consult requested. Pt seen and chart reviewed. Pt is alert/oriented x4, calm, cooperative, and appropriate to situation. Pt denies suicidal/homicidal ideation and psychosis and does not appear to be responding to internal stimuli. Pt reports that he did indeed state he wanted to harm himself as a threat to staff when they did not give him the amount of fentanyl and other narcotics which he preferred. When we had this discussion, the pt was somnolent and perhaps slightly overmedicated in the ICU setting. However, he does not present as depressed or as a danger to himself or others objectively or subjectively. His presentation along with a detailed chart history of demanding narcotics is consistent with med-seeking behaviors and manipulative ideation rather than self-injurious ideation.   *Pt specifically asked to be discharged when I spoke to him and asked if I could do so. I informed him that he was cleared by our psychiatric team with the consult service and that discharge disposition would be up to the ICU/stepdown team at this point.    HPI:  I have reviewed  and concur with HPI elements below, modified as follows: "Lawrence Hanna is an 46 y.o. male. Pt reports SI with no plan. Pt denies HI and AVH. Pt denies current or past outpatient/inpatient treatment. Pt reports daily heroin and crack cocaine use. Pt states he uses a 1/2 gram a day of heroin and $50 worth of crack cocaine a day. Pt denies alcohol use. Pt states he is seeking detox from heroin. Pt has been diagnosed with COPD and Renal disorder. Pt denies abuse. Dr. Modesta Messing recommends overnight observation and am psych evaluation. "  Interval History 03/24/17: When in the ED< pt became irate with ED staff demanding narcotic pain medications. Pt presented to [facility] ICU Stepdown on 03/22/17 due to respiratory distress. Later treated on Bipap. Nursing staff report pt has been very verbally aggressive with staff members demanding pain medications.   Past Psychiatric History: polysubtance abuse  Risk to Self: Suicidal Ideation: No Suicidal Plan?: No Access to Means: No What has been your use of drugs/alcohol within the last 12 months?: heroin and crack cocaine How many times?: 0 Other Self Harm Risks: NA Triggers for Past Attempts: None known Intentional Self Injurious Behavior: None Risk to Others: Homicidal Ideation: No Thoughts of Harm to Others: No Current Homicidal Intent: No Current Homicidal Plan: No Access to Homicidal Means: No Identified Victim: NA History of harm to others?: No Assessment of Violence: None Noted Violent Behavior Description: NA Does patient have access to weapons?: No Criminal Charges Pending?: No Does patient have a court date: No Prior Inpatient Therapy: Prior Inpatient Therapy: No Prior Therapy Dates: Na Prior Therapy Facilty/Provider(s): NA Reason for Treatment: NA Prior Outpatient Therapy: Prior  Outpatient Therapy: No Prior Therapy Dates: NA Prior Therapy Facilty/Provider(s): nA Reason for Treatment: NA Does patient have an ACCT team?: No Does patient have  Intensive In-House Services?  : No Does patient have [facility] services? : No Does patient have P4CC services?: No  Past Medical History:  Past Medical History:  Diagnosis Date  . COPD (chronic obstructive pulmonary disease) ([place])   . Renal disorder    History reviewed. No pertinent surgical history. Family History:  Family History  Problem Relation Age of Onset  . Hypertension Father    Family Psychiatric  History: depression, polysubstance abuse Social History:  History  Alcohol Use No     History  Drug use: Unknown    Social History   Social History  . Marital status: Single    Spouse name: N/A  . Number of children: N/A  . Years of education: N/A   Social History Main Topics  . Smoking status: Current Every Day Smoker    Types: Cigarettes  . Smokeless tobacco: Never Used  . Alcohol use No  . Drug use: Unknown  . Sexual activity: Not Asked   Other Topics Concern  . None   Social History Narrative  . None   Additional Social History:    Allergies:   Allergies  Allergen Reactions  . Codeine Nausea Only    Labs:  Results for orders placed or performed during the hospital encounter of 03/21/17 (from the past 48 hour(s))  Culture, expectorated sputum-assessment     Status: None   Collection Time: 03/22/17  1:46 PM  Result Value Ref Range   Specimen Description SPU    Special Requests NONE    Sputum evaluation THIS SPECIMEN IS ACCEPTABLE FOR SPUTUM CULTURE    Report Status 03/22/2017 FINAL   Culture, respiratory (NON-Expectorated)     Status: None   Collection Time: 03/22/17  1:46 PM  Result Value Ref Range   Specimen Description SPU    Special Requests NONE Reflexed from [id number]    Gram Stain      RARE WBC PRESENT, PREDOMINANTLY PMN FEW GRAM POSITIVE COCCI IN PAIRS FEW GRAM NEGATIVE RODS RARE GRAM POSITIVE RODS RARE GRAM NEGATIVE DIPLOCOCCI    Culture      Consistent with normal respiratory flora. Performed at [facility], [place]  [address] [place], [place] [address]    Report Status 03/24/2017 FINAL   Troponin I     Status: None   Collection Time: 03/22/17  4:37 PM  Result Value Ref Range   Troponin I <0.03 <0.03 ng/mL  Basic metabolic panel     Status: Abnormal   Collection Time: 03/22/17  4:37 PM  Result Value Ref Range   Sodium 140 135 - 145 mmol/L   Potassium 3.9 3.5 - 5.1 mmol/L   Chloride 107 101 - 111 mmol/L   CO2 25 22 - 32 mmol/L   Glucose, Bld 192 (H) 65 - 99 mg/dL   BUN 10 6 - 20 mg/dL   Creatinine, Ser 0.77 0.61 - 1.24 mg/dL   Calcium 8.6 (L) 8.9 - 10.3 mg/dL   GFR calc non Af Amer >60 >60 mL/min   GFR calc Af Amer >60 >60 mL/min    Comment: (NOTE) The eGFR has been calculated using the CKD EPI equation. This calculation has not been validated in all clinical situations. eGFR's persistently <60 mL/min signify possible Chronic Kidney Disease.    Anion gap 8 5 - 15  Magnesium     Status:  Abnormal   Collection Time: 03/22/17  4:37 PM  Result Value Ref Range   Magnesium 3.0 (H) 1.7 - 2.4 mg/dL  Lactic acid, plasma     Status: None   Collection Time: 03/22/17  5:40 PM  Result Value Ref Range   Lactic Acid, Venous 1.1 0.5 - 1.9 mmol/L  Lactic acid, plasma     Status: None   Collection Time: 03/22/17  8:00 PM  Result Value Ref Range   Lactic Acid, Venous 0.6 0.5 - 1.9 mmol/L  Blood gas, arterial     Status: Abnormal   Collection Time: 03/22/17 11:20 PM  Result Value Ref Range   FIO2 0.32    Delivery systems NASAL CANNULA    pH, Arterial 7.382 7.350 - 7.450   pCO2 arterial 46.7 32.0 - 48.0 mmHg   pO2, Arterial 67.1 (L) 83.0 - 108.0 mmHg   Bicarbonate 27.0 20.0 - 28.0 mmol/L   Acid-Base Excess 2.0 0.0 - 2.0 mmol/L   O2 Saturation 91.4 %   Patient temperature 99.2    Collection site LEFT RADIAL    Drawn by [id number]    Sample type ARTERIAL    Allens test (pass/fail) PASS PASS   Mechanical Rate ARTERIAL   Basic metabolic panel     Status: Abnormal   Collection Time: 03/23/17  4:45 AM  Result  Value Ref Range   Sodium 140 135 - 145 mmol/L   Potassium 4.3 3.5 - 5.1 mmol/L   Chloride 107 101 - 111 mmol/L   CO2 28 22 - 32 mmol/L   Glucose, Bld 170 (H) 65 - 99 mg/dL   BUN 14 6 - 20 mg/dL   Creatinine, Ser 0.94 0.61 - 1.24 mg/dL   Calcium 8.6 (L) 8.9 - 10.3 mg/dL   GFR calc non Af Amer >60 >60 mL/min   GFR calc Af Amer >60 >60 mL/min    Comment: (NOTE) The eGFR has been calculated using the CKD EPI equation. This calculation has not been validated in all clinical situations. eGFR's persistently <60 mL/min signify possible Chronic Kidney Disease.    Anion gap 5 5 - 15  CBC     Status: Abnormal   Collection Time: 03/23/17  4:45 AM  Result Value Ref Range   WBC 16.0 (H) 4.0 - 10.5 K/uL   RBC 4.26 4.22 - 5.81 MIL/uL   Hemoglobin 12.3 (L) 13.0 - 17.0 g/dL   HCT 39.1 39.0 - 52.0 %   MCV 91.8 78.0 - 100.0 fL   MCH 28.9 26.0 - 34.0 pg   MCHC 31.5 30.0 - 36.0 g/dL   RDW 14.2 11.5 - 15.5 %   Platelets 308 150 - 400 K/uL  TSH     Status: None   Collection Time: 03/23/17  2:37 PM  Result Value Ref Range   TSH 0.571 0.350 - 4.500 uIU/mL    Comment: Performed by a 3rd Generation assay with a functional sensitivity of <=0.01 uIU/mL.  CBC     Status: Abnormal   Collection Time: 03/24/17  4:43 AM  Result Value Ref Range   WBC 11.5 (H) 4.0 - 10.5 K/uL   RBC 4.34 4.22 - 5.81 MIL/uL   Hemoglobin 12.5 (L) 13.0 - 17.0 g/dL   HCT 39.6 39.0 - 52.0 %   MCV 91.2 78.0 - 100.0 fL   MCH 28.8 26.0 - 34.0 pg   MCHC 31.6 30.0 - 36.0 g/dL   RDW 14.0 11.5 - 15.5 %   Platelets 298 150 - 400  K/uL  Basic metabolic panel     Status: Abnormal   Collection Time: 03/24/17  4:43 AM  Result Value Ref Range   Sodium 140 135 - 145 mmol/L   Potassium 4.4 3.5 - 5.1 mmol/L   Chloride 105 101 - 111 mmol/L   CO2 29 22 - 32 mmol/L   Glucose, Bld 165 (H) 65 - 99 mg/dL   BUN 14 6 - 20 mg/dL   Creatinine, Ser 0.94 0.61 - 1.24 mg/dL   Calcium 8.8 (L) 8.9 - 10.3 mg/dL   GFR calc non Af Amer >60 >60 mL/min    GFR calc Af Amer >60 >60 mL/min    Comment: (NOTE) The eGFR has been calculated using the CKD EPI equation. This calculation has not been validated in all clinical situations. eGFR's persistently <60 mL/min signify possible Chronic Kidney Disease.    Anion gap 6 5 - 15  Procalcitonin - Baseline     Status: None   Collection Time: 03/24/17 10:51 AM  Result Value Ref Range   Procalcitonin <0.10 ng/mL    Comment:        Interpretation: PCT (Procalcitonin) <= 0.5 ng/mL: Systemic infection (sepsis) is not likely. Local bacterial infection is possible. (NOTE)         ICU PCT Algorithm               Non ICU PCT Algorithm    ----------------------------     ------------------------------         PCT < 0.25 ng/mL                 PCT < 0.1 ng/mL     Stopping of antibiotics            Stopping of antibiotics       strongly encouraged.               strongly encouraged.    ----------------------------     ------------------------------       PCT level decrease by               PCT < 0.25 ng/mL       >= 80% from peak PCT       OR PCT 0.25 - 0.5 ng/mL          Stopping of antibiotics                                             encouraged.     Stopping of antibiotics           encouraged.    ----------------------------     ------------------------------       PCT level decrease by              PCT >= 0.25 ng/mL       < 80% from peak PCT        AND PCT >= 0.5 ng/mL            Continuin g antibiotics                                              encouraged.       Continuing antibiotics            encouraged.    ----------------------------     ------------------------------  PCT level increase compared          PCT > 0.5 ng/mL         with peak PCT AND          PCT >= 0.5 ng/mL             Escalation of antibiotics                                          strongly encouraged.      Escalation of antibiotics        strongly encouraged.     Current Facility-Administered Medications   Medication Dose Route Frequency Provider Last Rate Last Dose  . 0.9 %  sodium chloride infusion   Intravenous Continuous Regalado, Belkys A, MD 100 mL/hr at 03/24/17 0500    . aspirin chewable tablet 81 mg  81 mg Oral Daily Thurnell Lose, MD   81 mg at 03/24/17 0901  . azithromycin (ZITHROMAX) 500 mg in dextrose 5 % 250 mL IVPB  500 mg Intravenous Q24H Thurnell Lose, MD   Stopped at 03/23/17 1514  . budesonide (PULMICORT) nebulizer solution 0.5 mg  0.5 mg Nebulization BID Alfredo Martinez, Brandi L, NP   0.5 mg at 03/24/17 0827  . chlorhexidine (PERIDEX) 0.12 % solution 15 mL  15 mL Mouth Rinse BID Thurnell Lose, MD   15 mL at 03/24/17 0912  . Chlorhexidine Gluconate Cloth 2 % PADS 6 each  6 each Topical Daily Thurnell Lose, MD   6 each at 03/24/17 1152  . cloNIDine (CATAPRES) tablet 0.1 mg  0.1 mg Oral BH-qamhs Hisada, Reina, MD   0.1 mg at 03/24/17 0858   Followed by  . [START ON 03/26/2017] cloNIDine (CATAPRES) tablet 0.1 mg  0.1 mg Oral QAC breakfast Hisada, Reina, MD      . dexmedetomidine (PRECEDEX) 400 MCG/100ML (4 mcg/mL) infusion  0.4-1.5 mcg/kg/hr Intravenous Titrated Chesley Mires, MD 11.4 mL/hr at 03/24/17 1251 0.8 mcg/kg/hr at 03/24/17 1251  . dicyclomine (BENTYL) tablet 20 mg  20 mg Oral Q6H PRN Norman Clay, MD   20 mg at 03/21/17 2052  . fentaNYL (SUBLIMAZE) injection 25-100 mcg  25-100 mcg Intravenous Q2H PRN Noe Gens L, NP   100 mcg at 03/24/17 1247  . folic acid injection 1 mg  1 mg Intravenous Daily Regalado, Belkys A, MD   1 mg at 03/24/17 1149  . guaiFENesin-dextromethorphan (ROBITUSSIN DM) 100-10 MG/5ML syrup 5 mL  5 mL Oral Q4H PRN Thurnell Lose, MD   5 mL at 03/22/17 1616  . haloperidol lactate (HALDOL) injection 2 mg  2 mg Intravenous Q6H PRN Chesley Mires, MD   2 mg at 03/24/17 1251  . heparin injection 5,000 Units  5,000 Units Subcutaneous Q8H Thurnell Lose, MD   5,000 Units at 03/24/17 0501  . ipratropium (ATROVENT) nebulizer solution 0.5 mg  0.5 mg  Nebulization Q6H Ollis, Brandi L, NP   0.5 mg at 03/24/17 0823  . levalbuterol (XOPENEX) nebulizer solution 0.63 mg  0.63 mg Nebulization Q3H PRN Ollis, Brandi L, NP      . levalbuterol (XOPENEX) nebulizer solution 1.25 mg  1.25 mg Nebulization Q6H Ollis, Brandi L, NP   1.25 mg at 03/24/17 3086  . loperamide (IMODIUM) capsule 2-4 mg  2-4 mg Oral PRN Norman Clay, MD      . LORazepam (ATIVAN) injection 1-4  mg  1-4 mg Intravenous Q4H PRN Chesley Mires, MD   4 mg at 03/24/17 1146  . MEDLINE mouth rinse  15 mL Mouth Rinse q12n4p Thurnell Lose, MD   15 mL at 03/24/17 1153  . methylPREDNISolone sodium succinate (SOLU-MEDROL) 40 mg/mL injection 30 mg  30 mg Intravenous Q8H de Dios, Bellmead A, MD      . metoprolol tartrate (LOPRESSOR) injection 5 mg  5 mg Intravenous Q4H PRN Thurnell Lose, MD      . nicotine (NICODERM CQ - dosed in mg/24 hours) patch 21 mg  21 mg Transdermal Daily Palumbo, April, MD   21 mg at 03/24/17 0901  . ondansetron (ZOFRAN-ODT) disintegrating tablet 4 mg  4 mg Oral Q6H PRN Hisada, Reina, MD      . piperacillin-tazobactam (ZOSYN) IVPB 3.375 g  3.375 g Intravenous Q8H Berton Mount, RPH   Stopped at 03/24/17 0901  . sodium chloride flush (NS) 0.9 % injection 10-40 mL  10-40 mL Intracatheter PRN Thurnell Lose, MD   10 mL at 03/24/17 0856  . sodium chloride flush (NS) 0.9 % injection 3 mL  3 mL Intravenous Q12H Thurnell Lose, MD   3 mL at 03/24/17 1149  . thiamine (B-1) injection 100 mg  100 mg Intravenous Daily Regalado, Belkys A, MD   100 mg at 03/24/17 0902  . vancomycin (VANCOCIN) IVPB 750 mg/150 ml premix  750 mg Intravenous Q12H Berton Mount, RPH   Stopped at 03/24/17 1002    Musculoskeletal: Strength & Muscle Tone: decreased Gait & Station: in bed Patient leans: N/A  Psychiatric Specialty Exam: Physical Exam  Review of Systems  Psychiatric/Behavioral: Positive for depression and substance abuse. Negative for hallucinations and suicidal ideas.  The patient is nervous/anxious and has insomnia.   All other systems reviewed and are negative.   Blood pressure 134/66, pulse 75, temperature 97.3 F (36.3 C), temperature source Oral, resp. rate (!) 28, height _0  (1.702 m), weight 60.9 kg (134 lb 4.2 oz), SpO2 96 %.Body mass index is 21.03 kg/m.  General Appearance: Disheveled  Eye Contact:  Fair  Speech:  Clear and Coherent and Slow  Volume:  Decreased  Mood:  Anxious  Affect:  Appropriate and Congruent  Thought Process:  Coherent, Goal Directed, Linear and Descriptions of Associations: Intact  Orientation:  Full (Time, Place, and Person)  Thought Content:  Focused on narcotic medication, asking to go home and discharge if possible  Suicidal Thoughts:  No  Homicidal Thoughts:  No  Memory:  Immediate;   Fair Recent;   Fair Remote;   Fair  Judgement:  Fair  Insight:  Fair  Psychomotor Activity:  Normal  Concentration:  Concentration: Fair and Attention Span: Fair  Recall:  [employer] of Knowledge:  Fair  Language:  Fair  Akathisia:  No  Handed:    AIMS (if indicated):     Assets:  Communication Skills Desire for Improvement Resilience  ADL's:  Intact  Cognition:  WNL  Sleep:      Treatment Plan Summary: Substance-induced mood disorder, polysubstance abuse dependence with depression and anxiety, stable for outpatient management when medically cleared  Medications: -Continue clonidine/COWS detox protocol. However, pt is requesting discharge and does not meet inpatient psychiatric criteria at this time due to lack of dual diagnosis. Pt does not meet involuntary commitment criteria either.   -The Ativan dosage is very high. Please consider 1-52m q6h for CIWA >10 and discontinue the [id number] protocol  you have listed as this will encourage med-seeking behaviors and contribute to acute confusion  Disposition: Cleared by psychiatry team due to lack of inpatient criteria. However, pt will benefit from SW consult and  outpatient resources/referrals for NA/AA, IOP, psychiatry/counseling appointments  Benjamine Mola, FNP 03/24/2017 1:29 PM   Case discussed with physician extender on phone. Reviewed the information documented and agree with the treatment plan.  Julie Nay 03/25/2017 11:38 AM

## 2017-03-24 NOTE — Progress Notes (Signed)
Progress Note  Patient Name: Lawrence ParaSteven Hanna Date of Encounter: 03/24/2017  Primary Cardiologist: new - Dr. Jens Somrenshaw  Subjective   Patient is feeling well; denies chest pain, SOB, and palpitations. Pt wants to go home now and states he will stay clean. He says he's breathing fine; remains on 3 L [place].  Inpatient Medications    Scheduled Meds: . aspirin  81 mg Oral Daily  . budesonide (PULMICORT) nebulizer solution  0.5 mg Nebulization BID  . chlorhexidine  15 mL Mouth Rinse BID  . Chlorhexidine Gluconate Cloth  6 each Topical Daily  . cloNIDine  0.1 mg Oral BH-qamhs   Followed by  . [START ON 03/26/2017] cloNIDine  0.1 mg Oral QAC breakfast  . folic acid  1 mg Intravenous Daily  . heparin  5,000 Units Subcutaneous Q8H  . ipratropium  0.5 mg Nebulization Q6H  . levalbuterol  1.25 mg Nebulization Q6H  . mouth rinse  15 mL Mouth Rinse q12n4p  . methylPREDNISolone (SOLU-MEDROL) injection  30 mg Intravenous Q8H  . nicotine  21 mg Transdermal Daily  . sodium chloride flush  3 mL Intravenous Q12H  . thiamine injection  100 mg Intravenous Daily   Continuous Infusions: . sodium chloride 100 mL/hr at 03/24/17 0500  . azithromycin Stopped (03/23/17 1514)  . dexmedetomidine (PRECEDEX) IV infusion 0.5 mcg/kg/hr (03/24/17 1047)  . piperacillin-tazobactam (ZOSYN)  IV Stopped (03/24/17 0901)  . vancomycin 750 mg (03/24/17 0902)   PRN Meds: dicyclomine, fentaNYL (SUBLIMAZE) injection, guaiFENesin-dextromethorphan, haloperidol lactate, levalbuterol, loperamide, LORazepam, metoprolol tartrate, ondansetron, sodium chloride flush   Vital Signs    Vitals:   03/24/17 0752 03/24/17 0825 03/24/17 0827 03/24/17 0900  BP:    (!) 144/77  Pulse:    73  Resp:    (!) 31  Temp: (!) 96.8 F (36 C)     TempSrc: Axillary     SpO2:  98% 98% 92%  Weight:      Height:        Intake/Output Summary (Last 24 hours) at 03/24/17 1108 Last data filed at 03/24/17 1026  Gross per 24 hour  Intake           3525.26 ml  Output             4250 ml  Net          -724.74 ml   Filed Weights   03/22/17 1200 03/23/17 0500 03/24/17 0500  Weight: 125 lb 10.6 oz (57 kg) 125 lb 10.6 oz (57 kg) 134 lb 4.2 oz (60.9 kg)     Physical Exam   General: Well developed, well nourished, male appearing in no acute distress. Head: Normocephalic, atraumatic.  Neck: Supple, no JVD, bruits not assessed as pt couldn't hold his breath  Lungs:  Resp regular, coarse sounds throughout, slightly improved from yesterday Heart: RRR, difficult to assess with breath sounds Abdomen: Soft, non-tender, non-distended with normoactive bowel sounds. No hepatomegaly. No rebound/guarding. No obvious abdominal masses. Extremities: No clubbing, cyanosis, no edema. Distal pedal pulses are 2+ bilaterally. Neuro: Alert and oriented X 3. Moves all extremities spontaneously. Psych: Normal affect.  Labs    Chemistry Recent Labs Lab 03/21/17 0913  03/22/17 1637 03/23/17 0445 03/24/17 0443  NA 136  < > 140 140 140  K 3.4*  < > 3.9 4.3 4.4  CL 104  < > 107 107 105  CO2 25  < > 25 28 29   GLUCOSE 157*  < > 192* 170* 165*  BUN 9  < >  10 14 14   CREATININE 0.87  < > 0.77 0.94 0.94  CALCIUM 8.5*  < > 8.6* 8.6* 8.8*  PROT 6.4*  --   --   --   --   ALBUMIN 3.1*  --   --   --   --   AST 21  --   --   --   --   ALT 11*  --   --   --   --   ALKPHOS 64  --   --   --   --   BILITOT 0.7  --   --   --   --   GFRNONAA >60  < > >60 >60 >60  GFRAA >60  < > >60 >60 >60  ANIONGAP 7  < > 8 5 6   < > = values in this interval not displayed.   Hematology Recent Labs Lab 03/22/17 0953 03/23/17 0445 03/24/17 0443  WBC 18.3* 16.0* 11.5*  RBC 4.44 4.26 4.34  HGB 13.0 12.3* 12.5*  HCT 40.2 39.1 39.6  MCV 90.5 91.8 91.2  MCH 29.3 28.9 28.8  MCHC 32.3 31.5 31.6  RDW 13.9 14.2 14.0  PLT 313 308 298    Cardiac Enzymes Recent Labs Lab 03/21/17 0915 03/22/17 1637  TROPONINI <0.03 <0.03    DDimer  Recent Labs Lab 03/21/17 0913    DDIMER 1.83*     Radiology    Dg Chest Port 1 View  Result Date: 03/24/2017 CLINICAL DATA:  Respiratory failure EXAM: PORTABLE CHEST 1 VIEW COMPARISON:  Mar 21, 2017 chest radiograph and chest CT FINDINGS: A small portion of the lateral left base is not visualized. Central catheter tip is in the superior vena cava near the cavoatrial junction. No pneumothorax. No edema or consolidation. Heart is mildly enlarged with pulmonary vascularity within normal limits. No adenopathy. No bone lesions. IMPRESSION: No edema or consolidation. Mild cardiac prominence. Central catheter as described without evident pneumothorax. Electronically Signed   By: Bretta Bang III M.D.   On: 03/24/2017 07:17     Telemetry    Sinus rhythm with frequent premature comlexes - Personally Reviewed    Cardiac Studies   Echocardiogram 03/23/17: Study Conclusions - Left ventricle: The cavity size was normal. Wall thickness was   normal. Systolic function was normal. The estimated ejection   fraction was in the range of 55% to 60%. Wall motion was normal;   there were no regional wall motion abnormalities. Left   ventricular diastolic function parameters were normal. - Aortic valve: Sclerosis without stenosis. There was trivial   regurgitation. - Mitral valve: Mildly thickened leaflets . There was trivial   regurgitation. - Left atrium: The atrium was normal in size. - Right atrium: The atrium was at the upper limits of normal in   size. - Tricuspid valve: There was moderate eccentric regurgitation,   directed toward the interatrial septum. - Pulmonary arteries: PA peak pressure: 28 mm Hg (S). - Inferior vena cava: The vessel was dilated. The respirophasic   diameter changes were blunted (< 50%), consistent with elevated   central venous pressure.  Impressions: - LVEF 55-60%, normal wall thickness, normal wall motion, normal   diastolic function, aortic valve sclerosis with trivial central   AI,  trivial MR, moderate eccentric TR, RVSP 28 mmHG, dilated IVC,   normal LA size.  Patient Profile     46 y.o. male with a PMH significant for COPD and drug use (heroin and cocaine). He reported to the ED (  03/21/17) with shortness of breath and requested detox. He was subsequently found to have ectopic atrial tachycardia.  Assessment & Plan    1. Ectopic atrial tachycardia  - no further tachycardia noted on telemetry - sinus rhythm with premature complexes - no scheduled beta blocker given his cocaine use, IV lopressor PRN for tachycardia (has not been given) - recommend IV cardizem if rate control is needed - TSH WNL - echo without heart failure or wall motion abnormalities   2. Acute respiratory failure, pneumonia, COPD - pt off precedex and BiPAP - currently on 3L [place] - per primary team   3. Substance abuse - per primary team   Signed, Marcelino Duster , PA-C 11:08 AM 03/24/2017 Pager: [phone]  As above, patient seen and examined. He is more alert today and denies dyspnea. I have reviewed his telemetry personally. He has sinus with PACs and PVCs. his LV function is normal on echocardiogram. If he has more ectopic atrial tachycardia would add Cardizem. Would avoid beta blockade given cocaine use.  I'm not convinced he will be compliant with medications at home given his substance abuse problem. That is his primary issue and he needs rehabilitation. Other issues per primary care. We will sign off. Please call with questions.  Olga Millers, MD

## 2017-03-24 NOTE — Progress Notes (Signed)
Patient refuses to be cardiac monitored and also refused to wear pulse ox. RN educated patient on the importance of these tools, but patient still declined. Patient has been very combative this admission. Two RN's attempted to place cardiac monitoring on patient without success. Will continue to check on patient. Patient has a suicide sitter at bedside.

## 2017-03-24 NOTE — Progress Notes (Signed)
Pt agitated; screaming "I'm leaving with my family." Attempted to pull out PICC line. Pulled off O2 monitor, nasal cannula,  and leads. Began spitting at the sitter, and shouting that he needed to be "knocked out." Relaxation techniques attempted, including deep breathing; no improvement. CIWA scale completed; Meds given per MAR. Agitation continued.

## 2017-03-24 NOTE — Progress Notes (Signed)
CONSULT NOTE    Lawrence Hanna   WUJ:[id number]  DOB: 02-22-1971  DOA: 03/21/2017 PCP: Patient, No Pcp Per   Brief Narrative:  46 y/o with heroin abuse and COPD who presented to the ER for detox from heroin. While in the holding area, he developed dyspnea, cough and fevers and was admitted to the hospital for respiratory distress. He was placed on a BiPAP. CT revealed mild patchy opacity in RML. He was placed on a Precedex infusion to control agitation.   Subjective: No complaints. Resting comfortably.   Assessment & Plan:   Principal Problem:   Acute hypercapnic respiratory failure  - on 3 L O2- wean as able - ? Pneumonia on CT- apparently this was seen on a CT in Feb as well.  - cont BiPAP as needed - cont to treat for COPD exacerbation and possible aspiration pneumonia with Unasyn  Active Problems:     Delirium- drug abuse with Heroin and Cocaine - delirium suspected due to withdrawal- will cont to follow in SDU    Ectopic atrial tachycardia  - having PACs and PVCs - 2 d ECHO unrevealing - thyroid function reasonable - no further episodes- follow on telemetry - avoid B blocker and give CCB if further issues - appreciate cardiology input and assistance  Smoker - cont Nicoderm patch  DVT prophylaxis: Heparin Code Status: Full  Family Communication:  Disposition Plan: cont to follow in SDU Consultants:   PCCM  Cardiolgy Procedures:  2 D ECHO LVEF 55-60%, normal wall thickness, normal wall motion, normal   diastolic function, aortic valve sclerosis with trivial central   AI, trivial MR, moderate eccentric TR, RVSP 28 mmHG, dilated IVC,   normal LA size.  Antimicrobials:  Anti-infectives    Start     Dose/Rate Route Frequency Ordered Stop   03/22/17 2200  vancomycin (VANCOCIN) IVPB 750 mg/150 ml premix     750 mg 150 mL/hr over 60 Minutes Intravenous Every 12 hours 03/22/17 1232     03/22/17 2000  amoxicillin-clavulanate (AUGMENTIN) 875-125 MG per tablet 1  tablet  Status:  Discontinued     1 tablet Oral Every 12 hours 03/22/17 0420 03/22/17 0736   03/22/17 1400  azithromycin (ZITHROMAX) 500 mg in dextrose 5 % 250 mL IVPB     500 mg 250 mL/hr over 60 Minutes Intravenous Every 24 hours 03/22/17 1159     03/22/17 1400  vancomycin (VANCOCIN) IVPB 1000 mg/200 mL premix     1,000 mg 200 mL/hr over 60 Minutes Intravenous  Once 03/22/17 1232 03/22/17 1654   03/22/17 1400  piperacillin-tazobactam (ZOSYN) IVPB 3.375 g     3.375 g 12.5 mL/hr over 240 Minutes Intravenous Every 8 hours 03/22/17 1232     03/22/17 0730  cefTRIAXone (ROCEPHIN) 1 g in dextrose 5 % 50 mL IVPB  Status:  Discontinued     1 g 100 mL/hr over 30 Minutes Intravenous  Once 03/22/17 0720 03/22/17 1117   03/22/17 0730  azithromycin (ZITHROMAX) 500 mg in dextrose 5 % 250 mL IVPB  Status:  Discontinued     500 mg 250 mL/hr over 60 Minutes Intravenous  Once 03/22/17 0720 03/22/17 1117       Objective: Vitals:   03/24/17 0900 03/24/17 1138 03/24/17 1200 03/24/17 1300  BP: (!) 144/77  (!) 143/87 134/66  Pulse: 73  81 75  Resp: (!) 31  (!) 31 (!) 28  Temp:  97.3 F (36.3 C)    TempSrc:  Oral  SpO2: 92%  98% 96%  Weight:      Height:        Intake/Output Summary (Last 24 hours) at 03/24/17 1401 Last data filed at 03/24/17 1258  Gross per 24 hour  Intake          3435.05 ml  Output             4750 ml  Net         -1314.95 ml   Filed Weights   03/22/17 1200 03/23/17 0500 03/24/17 0500  Weight: 57 kg (125 lb 10.6 oz) 57 kg (125 lb 10.6 oz) 60.9 kg (134 lb 4.2 oz)    Examination: General exam: Appears comfortable  HEENT: PERRLA, oral mucosa moist, no sclera icterus or thrush Respiratory system: Clear to auscultation. Respiratory effort normal. Cardiovascular system: S1 & S2 heard, RRR.  No murmurs  Gastrointestinal system: Abdomen soft, non-tender, nondistended. Normal bowel sound. No organomegaly Central nervous system: Alert and oriented. No focal neurological  deficits. Extremities: No cyanosis, clubbing or edema Skin: No rashes or ulcers Psychiatry:  Mood & affect appropriate.    Data Reviewed: I have personally reviewed following labs and imaging studies  CBC:  Recent Labs Lab 03/21/17 0913 03/22/17 0953 03/23/17 0445 03/24/17 0443  WBC 9.9 18.3* 16.0* 11.5*  NEUTROABS 9.1*  --   --   --   HGB 13.0 13.0 12.3* 12.5*  HCT 39.6 40.2 39.1 39.6  MCV 90.2 90.5 91.8 91.2  PLT 276 313 308 298   Basic Metabolic Panel:  Recent Labs Lab 03/21/17 0913 03/22/17 0953 03/22/17 1637 03/23/17 0445 03/24/17 0443  NA 136 140 140 140 140  K 3.4* 3.4* 3.9 4.3 4.4  CL 104 105 107 107 105  CO2 25 22 25 28 29   GLUCOSE 157* 154* 192* 170* 165*  BUN 9 12 10 14 14   CREATININE 0.87 0.89 0.77 0.94 0.94  CALCIUM 8.5* 8.6* 8.6* 8.6* 8.8*  MG  --   --  3.0*  --   --    GFR: Estimated Creatinine Clearance: 83.7 mL/min (by C-G formula based on SCr of 0.94 mg/dL). Liver Function Tests:  Recent Labs Lab 03/21/17 0913  AST 21  ALT 11*  ALKPHOS 64  BILITOT 0.7  PROT 6.4*  ALBUMIN 3.1*   No results for input(s): LIPASE, AMYLASE in the last 168 hours. No results for input(s): AMMONIA in the last 168 hours. Coagulation Profile: No results for input(s): INR, PROTIME in the last 168 hours. Cardiac Enzymes:  Recent Labs Lab 03/21/17 0915 03/22/17 1637  TROPONINI <0.03 <0.03   BNP (last 3 results) No results for input(s): PROBNP in the last 8760 hours. HbA1C: No results for input(s): HGBA1C in the last 72 hours. CBG: No results for input(s): GLUCAP in the last 168 hours. Lipid Profile: No results for input(s): CHOL, HDL, LDLCALC, TRIG, CHOLHDL, LDLDIRECT in the last 72 hours. Thyroid Function Tests:  Recent Labs  03/23/17 1437  TSH 0.571   Anemia Panel: No results for input(s): VITAMINB12, FOLATE, FERRITIN, TIBC, IRON, RETICCTPCT in the last 72 hours. Urine analysis:    Component Value Date/Time   COLORURINE Yellow 11/11/2012  1805   APPEARANCEUR Clear 11/11/2012 1805   LABSPEC 1.008 11/11/2012 1805   PHURINE 6.0 11/11/2012 1805   GLUCOSEU Negative 11/11/2012 1805   HGBUR 1+ 11/11/2012 1805   BILIRUBINUR Negative 11/11/2012 1805   KETONESUR Negative 11/11/2012 1805   PROTEINUR Negative 11/11/2012 1805   NITRITE Negative 11/11/2012 1805  LEUKOCYTESUR 1+ 11/11/2012 1805   Sepsis Labs: @LABRCNTIP (procalcitonin:4,lacticidven:4) ) Recent Results (from the past 240 hour(s))  MRSA PCR Screening     Status: None   Collection Time: 03/22/17 12:00 PM  Result Value Ref Range Status   MRSA by PCR NEGATIVE NEGATIVE Final    Comment:        The GeneXpert MRSA Assay (FDA approved for NASAL specimens only), is one component of a comprehensive MRSA colonization surveillance program. It is not intended to diagnose MRSA infection nor to guide or monitor treatment for MRSA infections.   Culture, blood (routine x 2)     Status: None (Preliminary result)   Collection Time: 03/22/17 12:37 PM  Result Value Ref Range Status   Specimen Description BLOOD BLOOD LEFT HAND  Final   Special Requests IN PEDIATRIC BOTTLE Blood Culture adequate volume  Final   Culture   Final    NO GROWTH 2 DAYS Performed at [facility], [address], [place], [place] [address]    Report Status PENDING  Incomplete  Culture, blood (routine x 2)     Status: None (Preliminary result)   Collection Time: 03/22/17 12:37 PM  Result Value Ref Range Status   Specimen Description BLOOD BLOOD RIGHT ARM  Final   Special Requests IN PEDIATRIC BOTTLE Blood Culture adequate volume  Final   Culture   Final    NO GROWTH 2 DAYS Performed at [facility], [address], [place], [place] [address]    Report Status PENDING  Incomplete  Culture, expectorated sputum-assessment     Status: None   Collection Time: 03/22/17  1:46 PM  Result Value Ref Range Status   Specimen Description SPU  Final   Special Requests NONE  Final   Sputum  evaluation THIS SPECIMEN IS ACCEPTABLE FOR SPUTUM CULTURE  Final   Report Status 03/22/2017 FINAL  Final  Culture, respiratory (NON-Expectorated)     Status: None   Collection Time: 03/22/17  1:46 PM  Result Value Ref Range Status   Specimen Description SPU  Final   Special Requests NONE Reflexed from [id number]  Final   Gram Stain   Final    RARE WBC PRESENT, PREDOMINANTLY PMN FEW GRAM POSITIVE COCCI IN PAIRS FEW GRAM NEGATIVE RODS RARE GRAM POSITIVE RODS RARE GRAM NEGATIVE DIPLOCOCCI    Culture   Final    Consistent with normal respiratory flora. Performed at [facility], [address] [place], [place] [address]    Report Status 03/24/2017 FINAL  Final         Radiology Studies: Dg Chest Port 1 View  Result Date: 03/24/2017 CLINICAL DATA:  Respiratory failure EXAM: PORTABLE CHEST 1 VIEW COMPARISON:  Mar 21, 2017 chest radiograph and chest CT FINDINGS: A small portion of the lateral left base is not visualized. Central catheter tip is in the superior vena cava near the cavoatrial junction. No pneumothorax. No edema or consolidation. Heart is mildly enlarged with pulmonary vascularity within normal limits. No adenopathy. No bone lesions. IMPRESSION: No edema or consolidation. Mild cardiac prominence. Central catheter as described without evident pneumothorax. Electronically Signed   By: Bretta Bang III M.D.   On: 03/24/2017 07:17      Scheduled Meds: . aspirin  81 mg Oral Daily  . budesonide (PULMICORT) nebulizer solution  0.5 mg Nebulization BID  . chlorhexidine  15 mL Mouth Rinse BID  . Chlorhexidine Gluconate Cloth  6 each Topical Daily  . cloNIDine  0.1  mg Oral BH-qamhs   Followed by  . [START ON 03/26/2017] cloNIDine  0.1 mg Oral QAC breakfast  . folic acid  1 mg Intravenous Daily  . heparin  5,000 Units Subcutaneous Q8H  . ipratropium  0.5 mg Nebulization Q6H  . levalbuterol  1.25 mg Nebulization Q6H  . mouth rinse  15 mL Mouth Rinse q12n4p  .  methylPREDNISolone (SOLU-MEDROL) injection  30 mg Intravenous Q8H  . nicotine  21 mg Transdermal Daily  . sodium chloride flush  3 mL Intravenous Q12H  . thiamine injection  100 mg Intravenous Daily   Continuous Infusions: . sodium chloride 100 mL/hr at 03/24/17 0500  . azithromycin Stopped (03/23/17 1514)  . dexmedetomidine (PRECEDEX) IV infusion 0.8 mcg/kg/hr (03/24/17 1251)  . piperacillin-tazobactam (ZOSYN)  IV Stopped (03/24/17 0901)  . vancomycin Stopped (03/24/17 1002)     LOS: 2 days    Time spent in minutes: 35    Calvert Cantor, MD [facility] Pager: [email or web address] Password [email or web address] 03/24/2017, 2:01 PM

## 2017-03-24 NOTE — Progress Notes (Signed)
Pt refuses to wear cardiac monitor. Will continue education about need for monitor and encourage compliance.

## 2017-03-25 ENCOUNTER — Inpatient Hospital Stay (HOSPITAL_COMMUNITY): Payer: Medicaid Other

## 2017-03-25 DIAGNOSIS — F1994 Other psychoactive substance use, unspecified with psychoactive substance-induced mood disorder: Secondary | ICD-10-CM

## 2017-03-25 DIAGNOSIS — J690 Pneumonitis due to inhalation of food and vomit: Secondary | ICD-10-CM

## 2017-03-25 DIAGNOSIS — F1920 Other psychoactive substance dependence, uncomplicated: Secondary | ICD-10-CM | POA: Diagnosis present

## 2017-03-25 DIAGNOSIS — J9601 Acute respiratory failure with hypoxia: Secondary | ICD-10-CM

## 2017-03-25 LAB — BASIC METABOLIC PANEL
Anion gap: 9 (ref 5–15)
BUN: 14 mg/dL (ref 6–20)
CALCIUM: 9.4 mg/dL (ref 8.9–10.3)
CO2: 28 mmol/L (ref 22–32)
CREATININE: 0.91 mg/dL (ref 0.61–1.24)
Chloride: 102 mmol/L (ref 101–111)
GFR calc Af Amer: >60 mL/min (ref >60)
Glucose, Bld: 128 mg/dL — ABNORMAL HIGH (ref 65–99)
POTASSIUM: 4.3 mmol/L (ref 3.5–5.1)
SODIUM: 139 mmol/L (ref 135–145)

## 2017-03-25 LAB — CBC
HCT: 44.3 % (ref 39.0–52.0)
Hemoglobin: 14.8 g/dL (ref 13.0–17.0)
MCH: 29.8 pg (ref 26.0–34.0)
MCHC: 33.4 g/dL (ref 30.0–36.0)
MCV: 89.1 fL (ref 78.0–100.0)
Platelets: 339 10*3/uL (ref 150–400)
RBC: 4.97 MIL/uL (ref 4.22–5.81)
RDW: 13.9 % (ref 11.5–15.5)
WBC: 15.1 10*3/uL — AB (ref 4.0–10.5)

## 2017-03-25 LAB — PROCALCITONIN

## 2017-03-25 MED ORDER — LORAZEPAM 2 MG/ML IJ SOLN
1.0000 mg | INTRAMUSCULAR | Status: DC | PRN
  Administered 2017-03-25: 1 mg via INTRAVENOUS
  Filled 2017-03-25: qty 1

## 2017-03-25 MED ORDER — PREDNISONE 10 MG PO TABS: 60.0000 mg | ORAL_TABLET | Freq: Every day | ORAL | 0 refills | Status: DC

## 2017-03-25 MED ORDER — HYDRALAZINE HCL 20 MG/ML IJ SOLN
10.0000 mg | INTRAMUSCULAR | Status: DC | PRN
  Administered 2017-03-25 (×2): 10 mg via INTRAVENOUS
  Filled 2017-03-25 (×2): qty 1

## 2017-03-25 MED ORDER — SODIUM CHLORIDE 0.9 % IV SOLN
3.0000 g | Freq: Four times a day (QID) | INTRAVENOUS | Status: DC
  Filled 2017-03-25: qty 3

## 2017-03-25 MED ORDER — BUDESONIDE-FORMOTEROL FUMARATE 80-4.5 MCG/ACT IN AERO: 2.0000 | INHALATION_SPRAY | Freq: Two times a day (BID) | RESPIRATORY_TRACT | 12 refills | Status: DC

## 2017-03-25 MED ORDER — AMOXICILLIN-POT CLAVULANATE 875-125 MG PO TABS: 1.0000 | ORAL_TABLET | Freq: Two times a day (BID) | ORAL | 0 refills | Status: AC

## 2017-03-25 MED ORDER — ALBUTEROL SULFATE HFA 108 (90 BASE) MCG/ACT IN AERS: 2.0000 | INHALATION_SPRAY | Freq: Four times a day (QID) | RESPIRATORY_TRACT | 2 refills | Status: DC | PRN

## 2017-03-25 NOTE — Progress Notes (Signed)
Pt is very agitated. He has pulled his cardiac monitoring off. MD made aware. Orders to consult social work and discharge pt home. Awaiting social work consult. Will continue to monitor.

## 2017-03-25 NOTE — Progress Notes (Signed)
Pt has called his family to come pick him up. Advised him to wait until they get here in an hour. Pt refuses to wait and insists on going outside to wait. Discharge instructions given. Will discharge pt.

## 2017-03-25 NOTE — Progress Notes (Signed)
Patient off precedex, walking in the hall with staff assistance.  PCCM will be available PRN.  Please call back if new needs arise.     Canary BrimBrandi Ollis, NP-C [facility] Pgr: [phone] or if no answer [phone] 03/25/2017, 9:50 AM    Agree as above. Noted pt plans to sign out AMA.    Pollie MeyerJ. Angelo A de Dios, MD 03/25/2017, 11:38 AM [facility] Pager [phone] After 3 pm or if no answer, call [phone]

## 2017-03-25 NOTE — Discharge Summary (Signed)
Physician Discharge Summary  Lawrence Hanna ZOX:[id number] DOB: 1971-10-12 DOA: 03/21/2017  PCP: Patient, No Pcp Per  Admit date: 03/21/2017 Discharge date: 03/25/2017  Admitted From: home Disposition:  home      Discharge Condition:  stable   CODE STATUS:  Full code   Consultations:  Psych  PCCM   Cardiology    Discharge Diagnoses:  Principal Problem:   Substance induced mood disorder (HCC) Active Problems:   Acute hypercapnic respiratory failure (HCC)   Delirium   Ectopic atrial tachycardia (HCC)   Polysubstance dependence including opioid type drug with complication, episodic abuse (HCC)   Aspiration pneumonia (HCC)  Narcotic seeker    Subjective: No complaints when evaluated this AM. No dyspnea. Cough is mild and now is non-productive.   Brief Summary: 46 y/o with heroin abuse and COPD who presented to the ER for detox from heroin. While in the holding area, he developed dyspnea, cough and fevers and was admitted to the hospital for respiratory distress. He was placed on a BiPAP. CT revealed mild patchy opacity in RML. He was placed on a Precedex infusion to control agitation.   Hospital Course:  Principal Problem:   Acute hypercapnic respiratory failure / COPD / Pneumonia - ? Pneumonia on CT- apparently this was seen on a CT in Feb as well.  -   BiPAP used intermittently now has been weaned to room air- pulse ox is in mid - high 90s on ambulation - cont to treat for COPD exacerbation and possible aspiration pneumonia - ok to d/c home today on Augmentin and Prednisone taper - have prescribed Symbicort and Albuterol for COPD  Active Problems:     Delirium- drug abuse with Heroin and Cocaine There was some questionable suicidal ideation as well - psych has evaluated him and determined him to be stable and safe for discharge home - delirium suspected due to withdrawal  - social work consult requested to give him literature in regards to rehab programs    Ectopic  atrial tachycardia  - having PACs and PVCs - 2 d ECHO unrevealing - thyroid function normal - no further episodes noted on telemetry - appreciate cardiology input and assistance- no further work up needed - avoid B blocker (due to cocaine abuse) and give CCB if further issues in the future  Smoker -   Nicoderm patch   Discharge Instructions  Discharge Instructions    Diet - low sodium heart healthy    Complete by:  As directed    Increase activity slowly    Complete by:  As directed      Allergies as of 03/25/2017      Reactions   Codeine Nausea Only      Medication List    TAKE these medications   albuterol 108 (90 Base) MCG/ACT inhaler Commonly known as:  PROVENTIL HFA;VENTOLIN HFA Inhale 2 puffs into the lungs every 6 (six) hours as needed for wheezing or shortness of breath.   amoxicillin-clavulanate 875-125 MG tablet Commonly known as:  AUGMENTIN Take 1 tablet by mouth every 12 (twelve) hours.   budesonide-formoterol 80-4.5 MCG/ACT inhaler Commonly known as:  SYMBICORT Inhale 2 puffs into the lungs 2 (two) times daily.   predniSONE 10 MG tablet Commonly known as:  DELTASONE Take 6 tablets (60 mg total) by mouth daily with breakfast. Take 6 tabs tomorrow and then decrease by 1 tab daily until finished       Allergies  Allergen Reactions  . Codeine Nausea Only  Procedures/Studies:    Dg Chest 2 View  Result Date: 03/21/2017 CLINICAL DATA:  Shortness of breath, cough, congestion EXAM: CHEST  2 VIEW COMPARISON:  None. FINDINGS: Lungs are clear.  No pleural effusion or pneumothorax. The heart is normal in size. Visualized osseous structures are within normal limits. IMPRESSION: Normal chest radiograph. Electronically Signed   By: Charline Bills M.D.   On: 03/21/2017 08:52   Ct Angio Chest Pe W Or Wo Contrast  Result Date: 03/21/2017 CLINICAL DATA:  Shortness of breath, cough EXAM: CT ANGIOGRAPHY CHEST WITH CONTRAST TECHNIQUE: Multidetector CT  imaging of the chest was performed using the standard protocol during bolus administration of intravenous contrast. Multiplanar CT image reconstructions and MIPs were obtained to evaluate the vascular anatomy. CONTRAST:  100 mL Isovue 370 IV COMPARISON:  Chest radiographs dated 03/21/2017. FINDINGS: Cardiovascular: Satisfactory opacification the bilateral pulmonary artery to the segmental level. Evaluation is mildly constrained due to respiratory motion. No evidence pulmonary embolism. The heart is normal in size.  No pericardial effusion. No evidence of thoracic aortic aneurysm. Mediastinum/Nodes: No suspicious mediastinal lymphadenopathy. Visualized thyroid is unremarkable. Lungs/Pleura: Evaluation of the lung parenchyma is constrained by respiratory motion. Mild centrilobular and paraseptal emphysematous changes, upper lobe predominant. Very mild patchy opacity in the medial right middle lobe (series 7/ image 66), likely infectious. Minimal left basilar opacity, likely atelectasis. No suspicious pulmonary nodules. No pleural effusion or pneumothorax. Upper Abdomen: Visualized upper abdomen is unremarkable. Musculoskeletal: Visualized osseous structures are within normal limits. Review of the MIP images confirms the above findings. IMPRESSION: No evidence pulmonary embolism. Very mild patchy opacity in the medial right middle lobe, likely reflecting mild infection. Electronically Signed   By: Charline Bills M.D.   On: 03/21/2017 10:45   Dg Chest Port 1 View  Result Date: 03/25/2017 CLINICAL DATA:  Respiratory failure. EXAM: PORTABLE CHEST 1 VIEW COMPARISON:  03/24/2017.  03/21/2017.  CT 03/21/2017 . FINDINGS: Right PICC line noted stable position. Cardiomegaly. No focal infiltrate. No pleural effusion or pneumothorax. No acute bony abnormality. IMPRESSION: 1. Right PICC line in stable position. 2.  Stable cardiomegaly.  No focal infiltrate. Electronically Signed   By: Maisie Fus  Register   On: 03/25/2017 07:33    Dg Chest Port 1 View  Result Date: 03/24/2017 CLINICAL DATA:  Respiratory failure EXAM: PORTABLE CHEST 1 VIEW COMPARISON:  Mar 21, 2017 chest radiograph and chest CT FINDINGS: A small portion of the lateral left base is not visualized. Central catheter tip is in the superior vena cava near the cavoatrial junction. No pneumothorax. No edema or consolidation. Heart is mildly enlarged with pulmonary vascularity within normal limits. No adenopathy. No bone lesions. IMPRESSION: No edema or consolidation. Mild cardiac prominence. Central catheter as described without evident pneumothorax. Electronically Signed   By: Bretta Bang III M.D.   On: 03/24/2017 07:17       Discharge Exam: Vitals:   03/25/17 0844 03/25/17 1000  BP: 124/65 (!) 82/55  Pulse:  94  Resp: (!) 29 (!) 31  Temp:     Vitals:   03/25/17 0800 03/25/17 0829 03/25/17 0844 03/25/17 1000  BP:   124/65 (!) 82/55  Pulse: 60   94  Resp: (!) 21  (!) 29 (!) 31  Temp:      TempSrc:      SpO2: 91% 94%  95%  Weight:      Height:        General: Pt is alert, awake, not in acute distress Cardiovascular: RRR, S1/S2 +,  no rubs, no gallops Respiratory: CTA bilaterally, no wheezing, no rhonchi Abdominal: Soft, NT, ND, bowel sounds + Extremities: no edema, no cyanosis    The results of significant diagnostics from this hospitalization (including imaging, microbiology, ancillary and laboratory) are listed below for reference.     Microbiology: Recent Results (from the past 240 hour(s))  MRSA PCR Screening     Status: None   Collection Time: 03/22/17 12:00 PM  Result Value Ref Range Status   MRSA by PCR NEGATIVE NEGATIVE Final    Comment:        The GeneXpert MRSA Assay (FDA approved for NASAL specimens only), is one component of a comprehensive MRSA colonization surveillance program. It is not intended to diagnose MRSA infection nor to guide or monitor treatment for MRSA infections.   Culture, blood (routine x 2)      Status: None (Preliminary result)   Collection Time: 03/22/17 12:37 PM  Result Value Ref Range Status   Specimen Description BLOOD BLOOD LEFT HAND  Final   Special Requests IN PEDIATRIC BOTTLE Blood Culture adequate volume  Final   Culture   Final    NO GROWTH 2 DAYS Performed at [facility], [address], [place], [place] [address]    Report Status PENDING  Incomplete  Culture, blood (routine x 2)     Status: None (Preliminary result)   Collection Time: 03/22/17 12:37 PM  Result Value Ref Range Status   Specimen Description BLOOD BLOOD RIGHT ARM  Final   Special Requests IN PEDIATRIC BOTTLE Blood Culture adequate volume  Final   Culture   Final    NO GROWTH 2 DAYS Performed at [facility], [address] [place], [place] [address]    Report Status PENDING  Incomplete  Culture, expectorated sputum-assessment     Status: None   Collection Time: 03/22/17  1:46 PM  Result Value Ref Range Status   Specimen Description SPU  Final   Special Requests NONE  Final   Sputum evaluation THIS SPECIMEN IS ACCEPTABLE FOR SPUTUM CULTURE  Final   Report Status 03/22/2017 FINAL  Final  Culture, respiratory (NON-Expectorated)     Status: None   Collection Time: 03/22/17  1:46 PM  Result Value Ref Range Status   Specimen Description SPU  Final   Special Requests NONE Reflexed from [id number]  Final   Gram Stain   Final    RARE WBC PRESENT, PREDOMINANTLY PMN FEW GRAM POSITIVE COCCI IN PAIRS FEW GRAM NEGATIVE RODS RARE GRAM POSITIVE RODS RARE GRAM NEGATIVE DIPLOCOCCI    Culture   Final    Consistent with normal respiratory flora. Performed at [facility], [address] [place], [place] [address]    Report Status 03/24/2017 FINAL  Final     Labs: BNP (last 3 results) No results for input(s): BNP in the last 8760 hours. Basic Metabolic Panel:  Recent Labs Lab 03/22/17 0953 03/22/17 1637 03/23/17 0445 03/24/17 0443 03/25/17 0340  NA 140 140 140 140 139  K  3.4* 3.9 4.3 4.4 4.3  CL 105 107 107 105 102  CO2 22 25 28 29 28   GLUCOSE 154* 192* 170* 165* 128*  BUN 12 10 14 14 14   CREATININE 0.89 0.77 0.94 0.94 0.91  CALCIUM 8.6* 8.6* 8.6* 8.8* 9.4  MG  --  3.0*  --   --   --    Liver Function Tests:  Recent Labs Lab 03/21/17 0913  AST 21  ALT  11*  ALKPHOS 64  BILITOT 0.7  PROT 6.4*  ALBUMIN 3.1*   No results for input(s): LIPASE, AMYLASE in the last 168 hours. No results for input(s): AMMONIA in the last 168 hours. CBC:  Recent Labs Lab 03/21/17 0913 03/22/17 0953 03/23/17 0445 03/24/17 0443 03/25/17 0340  WBC 9.9 18.3* 16.0* 11.5* 15.1*  NEUTROABS 9.1*  --   --   --   --   HGB 13.0 13.0 12.3* 12.5* 14.8  HCT 39.6 40.2 39.1 39.6 44.3  MCV 90.2 90.5 91.8 91.2 89.1  PLT 276 313 308 298 339   Cardiac Enzymes:  Recent Labs Lab 03/21/17 0915 03/22/17 1637  TROPONINI <0.03 <0.03   BNP: Invalid input(s): POCBNP CBG: No results for input(s): GLUCAP in the last 168 hours. D-Dimer No results for input(s): DDIMER in the last 72 hours. Hgb A1c No results for input(s): HGBA1C in the last 72 hours. Lipid Profile No results for input(s): CHOL, HDL, LDLCALC, TRIG, CHOLHDL, LDLDIRECT in the last 72 hours. Thyroid function studies  Recent Labs  03/23/17 1437  TSH 0.571   Anemia work up No results for input(s): VITAMINB12, FOLATE, FERRITIN, TIBC, IRON, RETICCTPCT in the last 72 hours. Urinalysis    Component Value Date/Time   COLORURINE Yellow 11/11/2012 1805   APPEARANCEUR Clear 11/11/2012 1805   LABSPEC 1.008 11/11/2012 1805   PHURINE 6.0 11/11/2012 1805   GLUCOSEU Negative 11/11/2012 1805   HGBUR 1+ 11/11/2012 1805   BILIRUBINUR Negative 11/11/2012 1805   KETONESUR Negative 11/11/2012 1805   PROTEINUR Negative 11/11/2012 1805   NITRITE Negative 11/11/2012 1805   LEUKOCYTESUR 1+ 11/11/2012 1805   Sepsis Labs Invalid input(s): PROCALCITONIN,  WBC,  LACTICIDVEN Microbiology Recent Results (from the past 240  hour(s))  MRSA PCR Screening     Status: None   Collection Time: 03/22/17 12:00 PM  Result Value Ref Range Status   MRSA by PCR NEGATIVE NEGATIVE Final    Comment:        The GeneXpert MRSA Assay (FDA approved for NASAL specimens only), is one component of a comprehensive MRSA colonization surveillance program. It is not intended to diagnose MRSA infection nor to guide or monitor treatment for MRSA infections.   Culture, blood (routine x 2)     Status: None (Preliminary result)   Collection Time: 03/22/17 12:37 PM  Result Value Ref Range Status   Specimen Description BLOOD BLOOD LEFT HAND  Final   Special Requests IN PEDIATRIC BOTTLE Blood Culture adequate volume  Final   Culture   Final    NO GROWTH 2 DAYS Performed at [facility], [address] [place], [place] [address]    Report Status PENDING  Incomplete  Culture, blood (routine x 2)     Status: None (Preliminary result)   Collection Time: 03/22/17 12:37 PM  Result Value Ref Range Status   Specimen Description BLOOD BLOOD RIGHT ARM  Final   Special Requests IN PEDIATRIC BOTTLE Blood Culture adequate volume  Final   Culture   Final    NO GROWTH 2 DAYS Performed at [facility], [address] [place], [place] [address]    Report Status PENDING  Incomplete  Culture, expectorated sputum-assessment     Status: None   Collection Time: 03/22/17  1:46 PM  Result Value Ref Range Status   Specimen Description SPU  Final   Special Requests NONE  Final   Sputum evaluation THIS SPECIMEN IS ACCEPTABLE FOR SPUTUM CULTURE  Final  Report Status 03/22/2017 FINAL  Final  Culture, respiratory (NON-Expectorated)     Status: None   Collection Time: 03/22/17  1:46 PM  Result Value Ref Range Status   Specimen Description SPU  Final   Special Requests NONE Reflexed from [id number]  Final   Gram Stain   Final    RARE WBC PRESENT, PREDOMINANTLY PMN FEW GRAM POSITIVE COCCI IN PAIRS FEW GRAM NEGATIVE RODS RARE GRAM POSITIVE  RODS RARE GRAM NEGATIVE DIPLOCOCCI    Culture   Final    Consistent with normal respiratory flora. Performed at [facility], [address] [place], [place] [address]    Report Status 03/24/2017 FINAL  Final     Time coordinating discharge: Over 30 minutes  SIGNED:   Calvert Cantor, MD  [facility] 03/25/2017, 12:08 PM Pager   If 7PM-7AM, please contact night-coverage [email or web address] Password [email or web address]

## 2017-03-25 NOTE — Progress Notes (Signed)
Pt dc home prior to being seen by CSW.  Cori RazorJamie Dorean Daniello LCSW [phone]

## 2017-03-27 LAB — CULTURE, BLOOD (ROUTINE X 2)
Culture: NO GROWTH
Culture: NO GROWTH
Special Requests: ADEQUATE
Special Requests: ADEQUATE

## 2017-08-28 ENCOUNTER — Encounter (HOSPITAL_COMMUNITY): Payer: Self-pay | Admitting: Emergency Medicine

## 2017-08-28 ENCOUNTER — Emergency Department (HOSPITAL_COMMUNITY)
Admission: EM | Admit: 2017-08-28 | Discharge: 2017-08-28 | Disposition: A | Discharge status: Home / Self Care | Payer: Medicaid Other | Attending: Emergency Medicine | Admitting: Emergency Medicine

## 2017-08-28 ENCOUNTER — Inpatient Hospital Stay (HOSPITAL_COMMUNITY)
Admission: AD | Admit: 2017-08-28 | Discharge: 2017-09-02 | DRG: 885 | Disposition: A | Discharge status: Home / Self Care | Payer: Medicaid Other | Source: Intra-hospital | Attending: Psychiatry | Admitting: Psychiatry

## 2017-08-28 ENCOUNTER — Encounter (HOSPITAL_COMMUNITY): Payer: Self-pay | Admitting: *Deleted

## 2017-08-28 DIAGNOSIS — Z590 Homelessness: Secondary | ICD-10-CM

## 2017-08-28 DIAGNOSIS — I10 Essential (primary) hypertension: Secondary | ICD-10-CM | POA: Diagnosis not present

## 2017-08-28 DIAGNOSIS — R4582 Worries: Secondary | ICD-10-CM | POA: Diagnosis not present

## 2017-08-28 DIAGNOSIS — F1920 Other psychoactive substance dependence, uncomplicated: Secondary | ICD-10-CM

## 2017-08-28 DIAGNOSIS — Z885 Allergy status to narcotic agent status: Secondary | ICD-10-CM | POA: Diagnosis not present

## 2017-08-28 DIAGNOSIS — R450 Nervousness: Secondary | ICD-10-CM | POA: Diagnosis not present

## 2017-08-28 DIAGNOSIS — G4700 Insomnia, unspecified: Secondary | ICD-10-CM | POA: Diagnosis not present

## 2017-08-28 DIAGNOSIS — Z8679 Personal history of other diseases of the circulatory system: Secondary | ICD-10-CM | POA: Diagnosis not present

## 2017-08-28 DIAGNOSIS — Z915 Personal history of self-harm: Secondary | ICD-10-CM

## 2017-08-28 DIAGNOSIS — F1994 Other psychoactive substance use, unspecified with psychoactive substance-induced mood disorder: Secondary | ICD-10-CM | POA: Diagnosis present

## 2017-08-28 DIAGNOSIS — J449 Chronic obstructive pulmonary disease, unspecified: Secondary | ICD-10-CM | POA: Diagnosis present

## 2017-08-28 DIAGNOSIS — R748 Abnormal levels of other serum enzymes: Secondary | ICD-10-CM | POA: Diagnosis present

## 2017-08-28 DIAGNOSIS — F17210 Nicotine dependence, cigarettes, uncomplicated: Secondary | ICD-10-CM | POA: Diagnosis not present

## 2017-08-28 DIAGNOSIS — F322 Major depressive disorder, single episode, severe without psychotic features: Secondary | ICD-10-CM | POA: Diagnosis not present

## 2017-08-28 DIAGNOSIS — F1110 Opioid abuse, uncomplicated: Secondary | ICD-10-CM | POA: Diagnosis not present

## 2017-08-28 DIAGNOSIS — F329 Major depressive disorder, single episode, unspecified: Secondary | ICD-10-CM | POA: Insufficient documentation

## 2017-08-28 DIAGNOSIS — F419 Anxiety disorder, unspecified: Secondary | ICD-10-CM | POA: Diagnosis present

## 2017-08-28 DIAGNOSIS — R45851 Suicidal ideations: Secondary | ICD-10-CM | POA: Insufficient documentation

## 2017-08-28 DIAGNOSIS — F1919 Other psychoactive substance abuse with unspecified psychoactive substance-induced disorder: Secondary | ICD-10-CM | POA: Diagnosis present

## 2017-08-28 DIAGNOSIS — F1310 Sedative, hypnotic or anxiolytic abuse, uncomplicated: Secondary | ICD-10-CM | POA: Diagnosis not present

## 2017-08-28 DIAGNOSIS — R5383 Other fatigue: Secondary | ICD-10-CM | POA: Diagnosis not present

## 2017-08-28 DIAGNOSIS — F1410 Cocaine abuse, uncomplicated: Secondary | ICD-10-CM | POA: Diagnosis not present

## 2017-08-28 LAB — CBC
HCT: 45.3 % (ref 39.0–52.0)
Hemoglobin: 15.1 g/dL (ref 13.0–17.0)
MCH: 30.8 pg (ref 26.0–34.0)
MCHC: 33.3 g/dL (ref 30.0–36.0)
MCV: 92.3 fL (ref 78.0–100.0)
PLATELETS: 294 10*3/uL (ref 150–400)
RBC: 4.91 MIL/uL (ref 4.22–5.81)
RDW: 14 % (ref 11.5–15.5)
WBC: 7.2 10*3/uL (ref 4.0–10.5)

## 2017-08-28 LAB — COMPREHENSIVE METABOLIC PANEL
ALBUMIN: 3.8 g/dL (ref 3.5–5.0)
ALT: 97 U/L — ABNORMAL HIGH (ref 17–63)
AST: 94 U/L — AB (ref 15–41)
Alkaline Phosphatase: 78 U/L (ref 38–126)
Anion gap: 10 (ref 5–15)
BILIRUBIN TOTAL: 0.7 mg/dL (ref 0.3–1.2)
BUN: 12 mg/dL (ref 6–20)
CHLORIDE: 103 mmol/L (ref 101–111)
CO2: 26 mmol/L (ref 22–32)
Calcium: 9.4 mg/dL (ref 8.9–10.3)
Creatinine, Ser: 0.93 mg/dL (ref 0.61–1.24)
GFR calc Af Amer: >60 mL/min (ref >60)
GFR calc non Af Amer: >60 mL/min (ref >60)
GLUCOSE: 86 mg/dL (ref 65–99)
Potassium: 4.1 mmol/L (ref 3.5–5.1)
Sodium: 139 mmol/L (ref 135–145)
Total Protein: 7.1 g/dL (ref 6.5–8.1)

## 2017-08-28 LAB — ACETAMINOPHEN LEVEL

## 2017-08-28 LAB — RAPID URINE DRUG SCREEN, HOSP PERFORMED
Amphetamines: NOT DETECTED
Barbiturates: NOT DETECTED
Benzodiazepines: POSITIVE — AB
COCAINE: POSITIVE — AB
OPIATES: POSITIVE — AB
Tetrahydrocannabinol: NOT DETECTED

## 2017-08-28 LAB — SALICYLATE LEVEL

## 2017-08-28 LAB — ETHANOL

## 2017-08-28 MED ORDER — HYDROXYZINE HCL 25 MG PO TABS
25.0000 mg | ORAL_TABLET | Freq: Four times a day (QID) | ORAL | Status: DC | PRN
  Administered 2017-08-29 – 2017-09-01 (×5): 25 mg via ORAL
  Filled 2017-08-28 (×3): qty 1

## 2017-08-28 MED ORDER — GABAPENTIN 300 MG PO CAPS
300.0000 mg | ORAL_CAPSULE | Freq: Three times a day (TID) | ORAL | Status: DC
  Administered 2017-08-28 – 2017-08-29 (×3): 300 mg via ORAL
  Filled 2017-08-28 (×10): qty 1

## 2017-08-28 MED ORDER — MAGNESIUM HYDROXIDE 400 MG/5ML PO SUSP: 30.0000 mL | Freq: Every day | ORAL | Status: DC | PRN

## 2017-08-28 MED ORDER — MOMETASONE FURO-FORMOTEROL FUM 100-5 MCG/ACT IN AERO
2.0000 | INHALATION_SPRAY | Freq: Two times a day (BID) | RESPIRATORY_TRACT | Status: DC
  Filled 2017-08-28: qty 8.8

## 2017-08-28 MED ORDER — ONDANSETRON 4 MG PO TBDP
ORAL_TABLET | ORAL | Status: AC
  Filled 2017-08-28: qty 1

## 2017-08-28 MED ORDER — NAPROXEN 500 MG PO TABS: 500.0000 mg | ORAL_TABLET | Freq: Two times a day (BID) | ORAL | Status: DC | PRN

## 2017-08-28 MED ORDER — DICYCLOMINE HCL 20 MG PO TABS
ORAL_TABLET | ORAL | Status: AC
  Filled 2017-08-28: qty 1

## 2017-08-28 MED ORDER — LOPERAMIDE HCL 2 MG PO CAPS: 2.0000 mg | ORAL_CAPSULE | ORAL | Status: DC | PRN

## 2017-08-28 MED ORDER — ALUM & MAG HYDROXIDE-SIMETH 200-200-20 MG/5ML PO SUSP: 30.0000 mL | ORAL | Status: DC | PRN

## 2017-08-28 MED ORDER — ONDANSETRON 4 MG PO TBDP: 4.0000 mg | ORAL_TABLET | Freq: Four times a day (QID) | ORAL | Status: DC | PRN

## 2017-08-28 MED ORDER — MOMETASONE FURO-FORMOTEROL FUM 100-5 MCG/ACT IN AERO
2.0000 | INHALATION_SPRAY | Freq: Two times a day (BID) | RESPIRATORY_TRACT | Status: DC
  Administered 2017-08-28 – 2017-09-01 (×6): 2 via RESPIRATORY_TRACT
  Filled 2017-08-28 (×2): qty 8.8

## 2017-08-28 MED ORDER — DICYCLOMINE HCL 20 MG PO TABS
20.0000 mg | ORAL_TABLET | Freq: Four times a day (QID) | ORAL | Status: DC | PRN
  Administered 2017-08-28: 20 mg via ORAL

## 2017-08-28 MED ORDER — CLONIDINE HCL 0.1 MG PO TABS
0.1000 mg | ORAL_TABLET | Freq: Every day | ORAL | Status: DC
  Filled 2017-08-28: qty 1

## 2017-08-28 MED ORDER — CLONIDINE HCL 0.1 MG PO TABS
0.1000 mg | ORAL_TABLET | ORAL | Status: DC
  Filled 2017-08-28 (×4): qty 1

## 2017-08-28 MED ORDER — ACETAMINOPHEN 325 MG PO TABS: 650.0000 mg | ORAL_TABLET | Freq: Four times a day (QID) | ORAL | Status: DC | PRN

## 2017-08-28 MED ORDER — GABAPENTIN 300 MG PO CAPS
300.0000 mg | ORAL_CAPSULE | Freq: Three times a day (TID) | ORAL | Status: DC
  Administered 2017-08-28: 300 mg via ORAL
  Filled 2017-08-28: qty 1

## 2017-08-28 MED ORDER — ALBUTEROL SULFATE HFA 108 (90 BASE) MCG/ACT IN AERS: 2.0000 | INHALATION_SPRAY | Freq: Four times a day (QID) | RESPIRATORY_TRACT | Status: DC | PRN

## 2017-08-28 MED ORDER — METHOCARBAMOL 500 MG PO TABS: 500.0000 mg | ORAL_TABLET | Freq: Three times a day (TID) | ORAL | Status: DC | PRN

## 2017-08-28 MED ORDER — CLONIDINE HCL 0.1 MG PO TABS
0.1000 mg | ORAL_TABLET | Freq: Four times a day (QID) | ORAL | Status: AC
  Administered 2017-08-28 – 2017-08-30 (×7): 0.1 mg via ORAL
  Filled 2017-08-28 (×11): qty 1

## 2017-08-28 NOTE — BH Assessment (Addendum)
Assessment Note  Lawrence Hanna is an 46 y.o. male that presents this date with thoughts of self harm with a plan to cut his wrists. Patient states he has been residing with his mother who has recently asking him to leave her residence due to patient's escalating SA use. Patient was positive for opiates, cocaine and benzodiazepines. Patient admits to using IV Heroin (1 gram two to three times a week) last use on 08/27/17 when patient reported using 1 gram. Patient also admits to ongoing IV cocaine use reporting using up to 1 gram two to three times a week with last use on 08/27/17 when patient reported using 1 gram. Patient states he is having thoughts of self harm due to excessive SA use and not having a residence. Patient denies having any current OP provider or being on any MH medications. Patient stated he "thinks" he was diagnosed with depression  "years ago" but is vague in reference to symptoms. Per note review, patient was last admitted to [facility] on 03/21/17 for substance induced mood D/O and thoughts of self harm. Per note review,    patient was picked up by [employer] at a hotel due to patient calling emergency services to take him to the hospital. Patient is vague in reference to symptoms and renders conflicting history in reference to treatment and symptoms. Patient is stating he needing "long term care" because he can't stop using drugs. Patient denies any current withdrawals but states "they are coming." Patient denies any H/I or AVH. Patient is oriented to time/place. Case was staffed with Shaune Pollack DNP who recommended patient be monitored for safety and possible evaluation for medication management.     Diagnosis: MDD recurrent without psychotic features, moderate, cocaine use, opiate use.   Past Medical History:  Past Medical History:  Diagnosis Date  . COPD (chronic obstructive pulmonary disease) (HCC)   . Renal disorder     History reviewed. No pertinent surgical history.  Family History:  Family  History  Problem Relation Age of Onset  . Hypertension Father     Social History:  reports that he has been smoking Cigarettes.  He has never used smokeless tobacco. He reports that he does not drink alcohol. His drug history is not on file.  Additional Social History:  Alcohol / Drug Use Pain Medications: please see mar Prescriptions: please see mar Over the Counter: please see mar History of alcohol / drug use?: Yes Longest period of sobriety (when/how long): unknown Negative Consequences of Use: Personal relationships, Financial Withdrawal Symptoms:  (Denies current) Substance #1 Name of Substance 1: Opiates (Herion) 1 - Age of First Use: 29 1 - Amount (size/oz): 1 gram 1 - Frequency: Three to four times a week 1 - Duration: Last one year frequency has increased 1 - Last Use / Amount: 08/26/17 1 gram Substance #2 Name of Substance 2: Cocaine 2 - Age of First Use: unknown 2 - Amount (size/oz): 1 gram last use 2 - Frequency: daily 2 - Duration: ongoing 2 - Last Use / Amount: 08/27/17 1 gram  CIWA: CIWA-Ar BP: 106/81 Pulse Rate: 91 COWS:    Allergies:  Allergies  Allergen Reactions  . Codeine Nausea Only    Home Medications:  (Not in a hospital admission)  OB/GYN Status:  No LMP for male patient.  General Assessment Data Location of Assessment: [facility] ED TTS Assessment: In system Is this a Tele or Face-to-Face Assessment?: Face-to-Face Is this an Initial Assessment or a Re-assessment for this encounter?: Initial Assessment  Marital status: Single Maiden name: NA Is patient pregnant?: No Pregnancy Status: No Living Arrangements: Alone Can pt return to current living arrangement?: Yes Admission Status: Voluntary Is patient capable of signing voluntary admission?: Yes Referral Source: Self/Family/Friend Insurance type: Medicaid  Medical Screening Exam [facility]) Medical Exam completed: Yes  Crisis Care Plan Living Arrangements: Alone Legal Guardian:  Other: (NA) Name of Psychiatrist: None Name of Therapist: None  Education Status Is patient currently in school?: No Current Grade: NA Highest grade of school patient has completed: 10 Name of school: NA Contact person: NA  Risk to self with the past 6 months Suicidal Ideation: Yes-Currently Present Has patient been a risk to self within the past 6 months prior to admission? : Yes Suicidal Intent: Yes-Currently Present Has patient had any suicidal intent within the past 6 months prior to admission? : Yes Is patient at risk for suicide?: Yes Suicidal Plan?: Yes-Currently Present Has patient had any suicidal plan within the past 6 months prior to admission? : Yes Specify Current Suicidal Plan: Cut wrists Access to Means: Yes Specify Access to Suicidal Means: Pt states he "has a knife' What has been your use of drugs/alcohol within the last 12 months?: Current use Previous Attempts/Gestures: Yes How many times?:  (Pt states multiple but renders conflicting history) Other Self Harm Risks: NA Triggers for Past Attempts: Other (Comment) (Excessive SA use) Intentional Self Injurious Behavior: None Family Suicide History: No Recent stressful life event(s): Other (Comment) (Excessive SA use) Persecutory voices/beliefs?: No Depression: Yes Depression Symptoms: Feeling angry/irritable Substance abuse history and/or treatment for substance abuse?: Yes Suicide prevention information given to non-admitted patients: Not applicable  Risk to Others within the past 6 months Homicidal Ideation: No Does patient have any lifetime risk of violence toward others beyond the six months prior to admission? : No Thoughts of Harm to Others: No Current Homicidal Intent: No Current Homicidal Plan: No Access to Homicidal Means: No Identified Victim: NA History of harm to others?: No Assessment of Violence: None Noted Violent Behavior Description: NA Does patient have access to weapons?: No Criminal  Charges Pending?: No Does patient have a court date: No Is patient on probation?: No  Psychosis Hallucinations: None noted Delusions: None noted  Mental Status Report Appearance/Hygiene: Unremarkable Eye Contact: Fair Motor Activity: Freedom of movement Speech: Unremarkable Level of Consciousness: Irritable Mood: Anxious Affect: Angry Anxiety Level: Moderate Thought Processes: Coherent, Relevant Judgement: Partial Orientation: Place, Person, Time Obsessive Compulsive Thoughts/Behaviors: None  Cognitive Functioning Memory: Recent Intact, Remote Intact IQ: Average Insight: Poor Impulse Control: Poor Appetite: Fair Weight Loss: 0 Weight Gain: 0 Sleep: Decreased Total Hours of Sleep: 5 Vegetative Symptoms: None  ADLScreening [facility] Assessment Services) Patient's cognitive ability adequate to safely complete daily activities?: Yes Patient able to express need for assistance with ADLs?: Yes Independently performs ADLs?: Yes (appropriate for developmental age)  Prior Inpatient Therapy Prior Inpatient Therapy: Yes Prior Therapy Dates: 2018 Prior Therapy Facilty/Provider(s): [facility] Reason for Treatment: MH issues  Prior Outpatient Therapy Prior Outpatient Therapy: No Prior Therapy Dates: NA Prior Therapy Facilty/Provider(s): NA Reason for Treatment: NA Does patient have an ACCT team?: No Does patient have Intensive In-House Services?  : No Does patient have [facility] services? : No Does patient have P4CC services?: No  ADL Screening (condition at time of admission) Patient's cognitive ability adequate to safely complete daily activities?: Yes Is the patient deaf or have difficulty hearing?: No Does the patient have difficulty seeing, even when wearing glasses/contacts?: No Does  the patient have difficulty concentrating, remembering, or making decisions?: No Patient able to express need for assistance with ADLs?: Yes Does the patient have difficulty dressing or bathing?:  No Independently performs ADLs?: Yes (appropriate for developmental age) Does the patient have difficulty walking or climbing stairs?: No Weakness of Legs: None Weakness of Arms/Hands: None  Home Assistive Devices/Equipment Home Assistive Devices/Equipment: None  Therapy Consults (therapy consults require a physician order) PT Evaluation Needed: No OT Evalulation Needed: No SLP Evaluation Needed: No Abuse/Neglect Assessment (Assessment to be complete while patient is alone) Physical Abuse: Denies Verbal Abuse: Denies Sexual Abuse: Denies Exploitation of patient/patient's resources: Denies Self-Neglect: Denies Values / Beliefs Cultural Requests During Hospitalization: None Spiritual Requests During Hospitalization: None Consults Spiritual Care Consult Needed: No Social Work Consult Needed: No Merchant navy officerAdvance Directives (For Healthcare) Does Patient Have a Medical Advance Directive?: No Would patient like information on creating a medical advance directive?: No - Patient declined    Additional Information 1:1 In Past 12 Months?: No CIRT Risk: No Elopement Risk: No Does patient have medical clearance?: Yes     Disposition: Case was staffed with Shaune PollackLord DNP who recommended patient be monitored for safety and possible evaluation for medication management.       On Site Evaluation by:   Reviewed with Physician:    Alfredia Fergusonavid L Shellie Goettl 08/28/2017 10:22 AM

## 2017-08-28 NOTE — ED Notes (Signed)
Pt comes in with police escort to the ED. He stated SI and would like to be evaluated. He reports, "basically losing my family."

## 2017-08-28 NOTE — ED Triage Notes (Signed)
Patient was picked up by [facility]pd at a hotel due to patient calling for [employer]pd to take him to the hospital. Patient states he wants to cut his wrist.

## 2017-08-28 NOTE — ED Notes (Signed)
[facility] by [facility]. All belongings, including a red bag that was found in triage area, were returned to pt. Pt was calm and cooperative.

## 2017-08-28 NOTE — ED Notes (Signed)
[id number] Expected date:  Expected time:  Means of arrival:  Comments: Hold for hall b

## 2017-08-28 NOTE — BH Assessment (Signed)
Admission DAR Note: Pt is a 46 y/o caucasian male [facility] with [facility]. Pt presents with depressed affect and irritable mood. Denies HI, AVH and pain. Endorsed passive SI with plan to cut his wrist. Verbally contracts for safety. Per report and as confirmed by pt "my mom put me out because I've been using drugs". Pt states he's been using IV Heroin (1 gram two to three times a week) for approximately 20-25 years; last use on 08/27/17 when patient reported using 1 gram. Drinking on and off for 15-20 years now "but it's been worse for the last 2-3 years" states stressors is "life in general, just everything". Pt denies active withdrawal symptoms at this time. 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 Per pt "I don't work, I'm on disability for my lungs".  Skin assessment completed and belongings searched as per protocol. Pt's skin is intact but is dry and flaky, tattoos noted to right scapula and left deltoid. Items deemed contraband secured in locker. Emotional support and availability provided to pt. Encouraged pt to voice concerns, comply with treatment regimen including scheduled groups and unit activities. Unit orientation done and care plan reviewed with pt who verbalized understanding. Q 15 minutes safety checks initiated without self harm gestures or outburst to note. Will monitor pt and report changes in present condition.

## 2017-08-28 NOTE — ED Notes (Signed)
Pt belongings is in the cabinent under the ice maker in triage

## 2017-08-28 NOTE — Progress Notes (Signed)
Nursing Progress Note 1900-0730  D) Patient is isolative to his room this evening and did not attend group. Patient is observed in bed and reports "I don't feel well". Patient appears to have some mild withdrawal symptoms. Patient vital signs monitored and WNL. Patient provided snacks and Gatorade. Patient reports abdominal cramping and generalized body aches. Medications provided as ordered. Patient reports to Clinical research associatewriter, "they said my urine was positive for benzos but I wasn't taking any. Maybe it got mixed in with the coke". Patient currently denies SI/HI/AVH. Patient contracts for safety on the unit.  A) Emotional support given. 1:1 interaction and active listening provided. Patient medicated as prescribed. Medications and plan of care reviewed with patient. Patient verbalized understanding without further questions. Snacks and fluids provided. Opportunities for questions or concerns presented to patient. Patient encouraged to continue to work on treatment goals. Labs, vital signs and patient behavior monitored throughout shift. Patient safety maintained with q15 min safety checks. Low fall risk precautions in place and reviewed with patient; patient verbalized understanding.  R) Patient receptive to interaction with nurse. Patient remains safe on the unit at this time. Patient denies any adverse medication reactions at this time. Patient is resting in bed without complaints. Will continue to monitor.

## 2017-08-28 NOTE — ED Notes (Signed)
TTS PRESENT. AWARE PT WILL BE STAYING FOR ADMISSION

## 2017-08-28 NOTE — ED Provider Notes (Signed)
[facility]-EMERGENCY DEPT Provider Note   CSN: [id number] Arrival date & time: 08/28/17  0531     History   Chief Complaint Chief Complaint  Patient presents with  . Suicidal    HPI Lawrence Hanna is a 46 y.o. male.  The history is provided by the patient. No language interpreter was used.   Lawrence Hanna is a 46 y.o. male who presents to the Emergency Department complaining of SI.  He presents voluntarily to the emergency department for suicidal thoughts.  He states that he lost his family over the last week and now he wants to harm himself by cutting.  He has a history of suicide attempt in the past by cutting his wrists and trying to overdose.  He does smoke cigarettes and drinks several beers daily.  No history of EtOH withdrawal.  He also uses heroin and cocaine.  Last drug use was last night.  Symptoms are severe and constant in nature. Past Medical History:  Diagnosis Date  . COPD (chronic obstructive pulmonary disease) (HCC)   . Renal disorder     Patient Active Problem List   Diagnosis Date Noted  . Major depressive disorder, single episode, severe without psychotic features (HCC) 08/28/2017  . Substance induced mood disorder (HCC) 03/25/2017  . Polysubstance dependence including opioid type drug without complication, episodic abuse (HCC) 03/25/2017  . Aspiration pneumonia (HCC) 03/25/2017  . Delirium   . Ectopic atrial tachycardia (HCC)   . Acute hypercapnic respiratory failure (HCC) 03/22/2017  . COPD (chronic obstructive pulmonary disease) (HCC)   . Renal disorder     History reviewed. No pertinent surgical history.     Home Medications    Prior to Admission medications   Medication Sig Start Date End Date Taking? Authorizing Provider  albuterol (PROVENTIL HFA;VENTOLIN HFA) 108 (90 Base) MCG/ACT inhaler Inhale 2 puffs into the lungs every 6 (six) hours as needed for wheezing or shortness of breath. Patient not taking: Reported on  08/28/2017 03/25/17   Calvert Cantor, MD  budesonide-formoterol [facility]) 80-4.5 MCG/ACT inhaler Inhale 2 puffs into the lungs 2 (two) times daily. Patient not taking: Reported on 08/28/2017 03/25/17   Calvert Cantor, MD  predniSONE (DELTASONE) 10 MG tablet Take 6 tablets (60 mg total) by mouth daily with breakfast. Take 6 tabs tomorrow and then decrease by 1 tab daily until finished Patient not taking: Reported on 08/28/2017 03/25/17   Calvert Cantor, MD    Family History Family History  Problem Relation Age of Onset  . Hypertension Father     Social History Social History  Substance Use Topics  . Smoking status: Current Every Day Smoker    Types: Cigarettes  . Smokeless tobacco: Never Used  . Alcohol use No     Allergies   Codeine   Review of Systems Review of Systems  All other systems reviewed and are negative.    Physical Exam Updated Vital Signs BP 116/76   Pulse 85   Temp 98.6 F (37 C)   Resp 18   Ht 5\' 7"  (1.702 m)   Wt 66.7 kg (147 lb)   SpO2 96%   BMI 23.02 kg/m   Physical Exam  Constitutional: He is oriented to person, place, and time. He appears well-developed and well-nourished.  HENT:  Head: Normocephalic and atraumatic.  Cardiovascular: Normal rate and regular rhythm.   No murmur heard. Pulmonary/Chest: Effort normal. No respiratory distress.  Occasional end expiratory wheezes bilaterally  Abdominal: Soft. There is no tenderness. There  is no rebound and no guarding.  Musculoskeletal: He exhibits no edema or tenderness.  Neurological: He is alert and oriented to person, place, and time.  Skin: Skin is warm and dry.  Psychiatric:  Flat mood and affect.  Expresses SI.  Nursing note and vitals reviewed.    ED Treatments / Results  Labs (all labs ordered are listed, but only abnormal results are displayed) Labs Reviewed  COMPREHENSIVE METABOLIC PANEL - Abnormal; Notable for the following:       Result Value   AST 94 (*)    ALT 97 (*)    All  other components within normal limits  ACETAMINOPHEN LEVEL - Abnormal; Notable for the following:    Acetaminophen (Tylenol), Serum <10 (*)    All other components within normal limits  RAPID URINE DRUG SCREEN, HOSP PERFORMED - Abnormal; Notable for the following:    Opiates POSITIVE (*)    Cocaine POSITIVE (*)    Benzodiazepines POSITIVE (*)    All other components within normal limits  ETHANOL  SALICYLATE LEVEL  CBC    EKG  EKG Interpretation None       Radiology No results found.  Procedures Procedures (including critical care time)  Medications Ordered in ED Medications  albuterol (PROVENTIL HFA;VENTOLIN HFA) 108 (90 Base) MCG/ACT inhaler 2 puff (not administered)  mometasone-formoterol (DULERA) 100-5 MCG/ACT inhaler 2 puff (0 puffs Inhalation Hold 08/28/17 1219)  gabapentin (NEURONTIN) capsule 300 mg (300 mg Oral Given 08/28/17 1558)     Initial Impression / Assessment and Plan / ED Course  I have reviewed the triage vital signs and the nursing notes.  Pertinent labs & imaging results that were available during my care of the patient were reviewed by me and considered in my medical decision making (see chart for details).     Pt here for SI, plan to cut himself. He has been cleared medically for psychiatric evaluation and treatment.    Final Clinical Impressions(s) / ED Diagnoses   Final diagnoses:  Major depressive disorder, single episode, severe without psychotic features (HCC)  Polysubstance dependence including opioid type drug without complication, episodic abuse [facility](HCC)    New Prescriptions New Prescriptions   No medications on file     Tilden Fossaees, Tiwana Chavis, MD 08/28/17 1703

## 2017-08-28 NOTE — BH Assessment (Signed)
[facility] Assessment Progress Note    Case was staffed with Shaune PollackLord DNP who recommended patient be monitored for safety and possible evaluation for medication management.

## 2017-08-28 NOTE — Tx Team (Signed)
Initial Treatment Plan 08/28/2017 6:09 PM Lawrence Hanna [id number]    PATIENT STRESSORS: Financial difficulties Marital or family conflict Substance abuse   PATIENT STRENGTHS: Barrister's clerkCommunication skills Motivation for treatment/growth   PATIENT IDENTIFIED PROBLEMS: Depression  Anxiety  Substance abuse  "Getting clean"  "Managing my depression"             DISCHARGE CRITERIA:  Ability to meet basic life and health needs Improved stabilization in mood, thinking, and/or behavior  PRELIMINARY DISCHARGE PLAN: Attend 12-step recovery group Outpatient therapy  PATIENT/FAMILY INVOLVEMENT: This treatment plan has been presented to and reviewed with the patient, Lawrence ParaSteven Fishbaugh.  The patient and has been given the opportunity to ask questions and make suggestions.  Almira BarPenny G Regine Christian, RN 08/28/2017, 6:09 PM

## 2017-08-28 NOTE — Plan of Care (Signed)
Problem: Safety: Goal: Periods of time without injury will increase Outcome: Progressing Patient is on q15 minute safety checks and low fall risk precautions. Patient contracts for safety on the unit and remains safe at this time.   

## 2017-08-29 ENCOUNTER — Encounter (HOSPITAL_COMMUNITY): Payer: Self-pay | Admitting: Behavioral Health

## 2017-08-29 DIAGNOSIS — I10 Essential (primary) hypertension: Secondary | ICD-10-CM

## 2017-08-29 DIAGNOSIS — R45851 Suicidal ideations: Secondary | ICD-10-CM

## 2017-08-29 DIAGNOSIS — R450 Nervousness: Secondary | ICD-10-CM

## 2017-08-29 DIAGNOSIS — R4582 Worries: Secondary | ICD-10-CM

## 2017-08-29 DIAGNOSIS — F322 Major depressive disorder, single episode, severe without psychotic features: Principal | ICD-10-CM

## 2017-08-29 DIAGNOSIS — F419 Anxiety disorder, unspecified: Secondary | ICD-10-CM

## 2017-08-29 DIAGNOSIS — F17210 Nicotine dependence, cigarettes, uncomplicated: Secondary | ICD-10-CM

## 2017-08-29 DIAGNOSIS — F1310 Sedative, hypnotic or anxiolytic abuse, uncomplicated: Secondary | ICD-10-CM

## 2017-08-29 DIAGNOSIS — F1110 Opioid abuse, uncomplicated: Secondary | ICD-10-CM

## 2017-08-29 DIAGNOSIS — F1410 Cocaine abuse, uncomplicated: Secondary | ICD-10-CM

## 2017-08-29 DIAGNOSIS — G4700 Insomnia, unspecified: Secondary | ICD-10-CM

## 2017-08-29 DIAGNOSIS — R5383 Other fatigue: Secondary | ICD-10-CM

## 2017-08-29 DIAGNOSIS — F1994 Other psychoactive substance use, unspecified with psychoactive substance-induced mood disorder: Secondary | ICD-10-CM

## 2017-08-29 MED ORDER — GABAPENTIN 100 MG PO CAPS
100.0000 mg | ORAL_CAPSULE | Freq: Three times a day (TID) | ORAL | Status: DC
  Administered 2017-08-29 – 2017-09-01 (×8): 100 mg via ORAL
  Filled 2017-08-29 (×15): qty 1

## 2017-08-29 MED ORDER — TRAZODONE HCL 50 MG PO TABS
50.0000 mg | ORAL_TABLET | Freq: Every evening | ORAL | Status: DC | PRN
  Administered 2017-08-29: 50 mg via ORAL
  Filled 2017-08-29 (×7): qty 1

## 2017-08-29 MED ORDER — DULOXETINE HCL 20 MG PO CPEP
20.0000 mg | ORAL_CAPSULE | Freq: Every day | ORAL | Status: DC
  Administered 2017-08-29 – 2017-08-30 (×2): 20 mg via ORAL
  Filled 2017-08-29 (×5): qty 1

## 2017-08-29 NOTE — [facility] Suicide Risk Assessment (Signed)
[facility] INPATIENT:  Family/Significant Other Suicide Prevention Education  Suicide Prevention Education:  Patient Refusal for Family/Significant Other Suicide Prevention Education: The patient Lawrence Hanna has refused to provide written consent for family/significant other to be provided Family/Significant Other Suicide Prevention Education during admission and/or prior to discharge.  Physician notified.  Lawrence Hanna 08/29/2017, 3:34 PM

## 2017-08-29 NOTE — H&P (Signed)
Psychiatric Admission Assessment Adult  Patient Identification: Tyreece Gelles MRN:  [id number] Date of Evaluation:  08/29/2017 Chief Complaint:  MDD single episode,sev r Principal Diagnosis: Substance induced mood disorder ([place]) Diagnosis:   Patient Active Problem List   Diagnosis Date Noted  . Substance induced mood disorder ([place]) [F19.94] 03/25/2017    Priority: High  . Suicidal ideations [R45.851] 08/29/2017  . Major depressive disorder, single episode, severe without psychotic features ([place]) [F32.2] 08/28/2017  . Major depressive disorder, single episode, severe without psychosis ([place]) [F32.2] 08/28/2017  . Polysubstance dependence including opioid type drug without complication, episodic abuse ([place]) [F19.20] 03/25/2017  . Aspiration pneumonia ([employer]) [J69.0] 03/25/2017  . Delirium [R41.0]   . Ectopic atrial tachycardia (HCC) [I47.1]   . Acute hypercapnic respiratory failure ([place]) [J96.02] 03/22/2017  . COPD (chronic obstructive pulmonary disease) ([place]) [J44.9]   . Renal disorder [N28.9]      HPI: Below information from behavioral health assessment has been reviewed by me and I agreed with the findings:Montoya Schwarz is an 46 y.o. male that presents this date with thoughts of self harm with a plan to cut his wrists. Patient states he has been residing with his mother who has recently asking him to leave her residence due to patient's escalating SA use. Patient was positive for opiates, cocaine and benzodiazepines. Patient admits to using IV Heroin (1 gram two to three times a week) last use on 08/27/17 when patient reported using 1 gram. Patient also admits to ongoing IV cocaine use reporting using up to 1 gram two to three times a week with last use on 08/27/17 when patient reported using 1 gram. Patient states he is having thoughts of self harm due to excessive SA use and not having a residence. Patient denies having any current OP provider or being on any MH medications. Patient stated he  "thinks" he was diagnosed with depression  "years ago" but is vague in reference to symptoms. Per note review, patient was last admitted to [facility] on 03/21/17 for substance induced mood D/O and thoughts of self harm. Per note review,    patient was picked up by [employer] at a hotel due to patient calling emergency services to take him to the hospital. Patient is vague in reference to symptoms and renders conflicting history in reference to treatment and symptoms. Patient is stating he needing "long term care" because he can't stop using drugs. Patient denies any current withdrawals but states "they are coming." Patient denies any H/I or AVH. Patient is oriented to time/place. Case was staffed with Reita Cliche DNP who recommended patient be monitored for safety and possible evaluation for medication management.     Evaluation on the unit: 46 year old admitted to [facility] for SI and depression. Patient acknowledges his reason for admission. He reports worsening depression and SI secondary to a long term history of substance abuse. Reports this past Tuesday, his mother found him in the bathroom passed out after he overdosed on heroin. Reports using heroin daily however reports he has used cocaine (last use 3-4 months ago) and alcohol several times per week. He denies any legal issues related to his substance abuse. Reports attending several rehab programs in the past. Reports heroin use of 20 years.   Patient reports 2 prior SA by cutting his wrists and trying to overdose. He endorses daily suicidal thoughts. Describes current depressive symptoms as hopelessness, worthlessness, low mood, decreased appetite and sleeping pattern and guilt. He denies any history of hallucinations or self-injurious behaviors. Reports  no current outpatient provider for substance abuse or mental health illness. Reports no prior use of psychiatric medications. Denies family history of mental health illness. Reports the desire to seek inpatient treatment for  his substance abuse once discharged.     Patient reports after discharge, he has no place to go. He reports after his mother found him in the bathroom after overdosing on heroin, she put him out. He endorses this as one of his main concerns.   Medical history is remarkable for COPD.   Associated Signs/Symptoms: Depression Symptoms:  depressed mood, insomnia, feelings of worthlessness/guilt, hopelessness, suicidal thoughts without plan, loss of energy/fatigue, decreased appetite, (Hypo) Manic Symptoms:  none  Anxiety Symptoms:  Excessive Worry, Psychotic Symptoms:  denies PTSD Symptoms: NA Total Time spent with patient: 1 hour  Past Psychiatric History: polysubstance abuse.   Is the patient at risk to self? Yes.    Has the patient been a risk to self in the past 6 months? Yes.    Has the patient been a risk to self within the distant past? Yes.    Is the patient a risk to others? No.  Has the patient been a risk to others in the past 6 months? No.  Has the patient been a risk to others within the distant past? No.   Prior Inpatient Therapy:   Prior Outpatient Therapy:    Alcohol Screening: Patient refused Alcohol Screening Tool: Yes 1. How often do you have a drink containing alcohol?: 4 or more times a week("Every day") 2. How many drinks containing alcohol do you have on a typical day when you are drinking?: 3 or 4 3. How often do you have six or more drinks on one occasion?: Weekly AUDIT-C Score: 4 4. How often during the last year have you found that you were not able to stop drinking once you had started?: Daily or almost daily 5. How often during the last year have you failed to do what was normally expected from you becasue of drinking?: Weekly 6. How often during the last year have you needed a first drink in the morning to get yourself going after a heavy drinking session?: Daily or almost daily 7. How often during the last year have you had a feeling of guilt of  remorse after drinking?: Daily or almost daily 8. How often during the last year have you been unable to remember what happened the night before because you had been drinking?: Weekly 9. Have you or someone else been injured as a result of your drinking?: No 10. Has a relative or friend or a doctor or another health worker been concerned about your drinking or suggested you cut down?: Yes, during the last year Alcohol Use Disorder Identification Test Final Score (AUDIT): 30 Substance Abuse History in the last 12 months:  Yes.   Consequences of Substance Abuse: NA Previous Psychotropic Medications: No Psychological Evaluations: No  Past Medical History:  Past Medical History:  Diagnosis Date  . COPD (chronic obstructive pulmonary disease) ([place])   . Renal disorder    History reviewed. No pertinent surgical history. Family History:  Family History  Problem Relation Age of Onset  . Hypertension Father    Family Psychiatric  History: denies  Tobacco Screening: Have you used any form of tobacco in the last 30 days? (Cigarettes, Smokeless Tobacco, Cigars, and/or Pipes): Yes Tobacco use, Select all that apply: 5 or more cigarettes per day(" I smoke 1 pkt a day") Are you interested in  Tobacco Cessation Medications?: Yes, will notify MD for an order Counseled patient on smoking cessation including recognizing danger situations, developing coping skills and basic information about quitting provided: Yes Social History:  Social History   Substance and Sexual Activity  Alcohol Use No     Social History   Substance and Sexual Activity  Drug Use Not on file    Additional Social History:       Allergies:   Allergies  Allergen Reactions  . Codeine Nausea Only   Lab Results:  Results for orders placed or performed during the hospital encounter of 08/28/17 (from the past 48 hour(s))  Comprehensive metabolic panel     Status: Abnormal   Collection Time: 08/28/17  6:06 AM  Result Value  Ref Range   Sodium 139 135 - 145 mmol/L   Potassium 4.1 3.5 - 5.1 mmol/L   Chloride 103 101 - 111 mmol/L   CO2 26 22 - 32 mmol/L   Glucose, Bld 86 65 - 99 mg/dL   BUN 12 6 - 20 mg/dL   Creatinine, Ser 0.93 0.61 - 1.24 mg/dL   Calcium 9.4 8.9 - 10.3 mg/dL   Total Protein 7.1 6.5 - 8.1 g/dL   Albumin 3.8 3.5 - 5.0 g/dL   AST 94 (H) 15 - 41 U/L   ALT 97 (H) 17 - 63 U/L   Alkaline Phosphatase 78 38 - 126 U/L   Total Bilirubin 0.7 0.3 - 1.2 mg/dL   GFR calc non Af Amer >60 >60 mL/min   GFR calc Af Amer >60 >60 mL/min    Comment: (NOTE) The eGFR has been calculated using the CKD EPI equation. This calculation has not been validated in all clinical situations. eGFR's persistently <60 mL/min signify possible Chronic Kidney Disease.    Anion gap 10 5 - 15  Ethanol     Status: None   Collection Time: 08/28/17  6:06 AM  Result Value Ref Range   Alcohol, Ethyl (B) <10 <10 mg/dL    Comment:        LOWEST DETECTABLE LIMIT FOR SERUM ALCOHOL IS 10 mg/dL FOR MEDICAL PURPOSES ONLY   Salicylate level     Status: None   Collection Time: 08/28/17  6:06 AM  Result Value Ref Range   Salicylate Lvl <1.6 2.8 - 30.0 mg/dL  Acetaminophen level     Status: Abnormal   Collection Time: 08/28/17  6:06 AM  Result Value Ref Range   Acetaminophen (Tylenol), Serum <10 (L) 10 - 30 ug/mL    Comment:        THERAPEUTIC CONCENTRATIONS VARY SIGNIFICANTLY. A RANGE OF 10-30 ug/mL MAY BE AN EFFECTIVE CONCENTRATION FOR MANY PATIENTS. HOWEVER, SOME ARE BEST TREATED AT CONCENTRATIONS OUTSIDE THIS RANGE. ACETAMINOPHEN CONCENTRATIONS >150 ug/mL AT 4 HOURS AFTER INGESTION AND >50 ug/mL AT 12 HOURS AFTER INGESTION ARE OFTEN ASSOCIATED WITH TOXIC REACTIONS.   cbc     Status: None   Collection Time: 08/28/17  6:06 AM  Result Value Ref Range   WBC 7.2 4.0 - 10.5 K/uL   RBC 4.91 4.22 - 5.81 MIL/uL   Hemoglobin 15.1 13.0 - 17.0 g/dL   HCT 45.3 39.0 - 52.0 %   MCV 92.3 78.0 - 100.0 fL   MCH 30.8 26.0 - 34.0  pg   MCHC 33.3 30.0 - 36.0 g/dL   RDW 14.0 11.5 - 15.5 %   Platelets 294 150 - 400 K/uL  Rapid urine drug screen (hospital performed)     Status: Abnormal  Collection Time: 08/28/17  6:10 AM  Result Value Ref Range   Opiates POSITIVE (A) NONE DETECTED   Cocaine POSITIVE (A) NONE DETECTED   Benzodiazepines POSITIVE (A) NONE DETECTED   Amphetamines NONE DETECTED NONE DETECTED   Tetrahydrocannabinol NONE DETECTED NONE DETECTED   Barbiturates NONE DETECTED NONE DETECTED    Comment:        DRUG SCREEN FOR MEDICAL PURPOSES ONLY.  IF CONFIRMATION IS NEEDED FOR ANY PURPOSE, NOTIFY LAB WITHIN 5 DAYS.        LOWEST DETECTABLE LIMITS FOR URINE DRUG SCREEN Drug Class       Cutoff (ng/mL) Amphetamine      1000 Barbiturate      200 Benzodiazepine   720 Tricyclics       947 Opiates          300 Cocaine          300 THC              50     Blood Alcohol level:  Lab Results  Component Value Date   ETH <10 08/28/2017   ETH <5 09/62/8366    Metabolic Disorder Labs:  No results found for: HGBA1C, MPG No results found for: PROLACTIN No results found for: CHOL, TRIG, HDL, CHOLHDL, VLDL, LDLCALC  Current Medications: Current Facility-Administered Medications  Medication Dose Route Frequency Provider Last Rate Last Dose  . acetaminophen (TYLENOL) tablet 650 mg  650 mg Oral Q6H PRN Patrecia Pour, NP      . albuterol (PROVENTIL HFA;VENTOLIN HFA) 108 (90 Base) MCG/ACT inhaler 2 puff  2 puff Inhalation Q6H PRN Patrecia Pour, NP      . alum & mag hydroxide-simeth (MAALOX/MYLANTA) 200-200-20 MG/5ML suspension 30 mL  30 mL Oral Q4H PRN Patrecia Pour, NP      . cloNIDine (CATAPRES) tablet 0.1 mg  0.1 mg Oral QID Lindon Romp A, NP   0.1 mg at 08/29/17 2947   Followed by  . [START ON 08/31/2017] cloNIDine (CATAPRES) tablet 0.1 mg  0.1 mg Oral BH-qamhs Rozetta Nunnery, NP       Followed by  . [START ON 09/03/2017] cloNIDine (CATAPRES) tablet 0.1 mg  0.1 mg Oral QAC breakfast Lindon Romp A,  NP      . dicyclomine (BENTYL) tablet 20 mg  20 mg Oral Q6H PRN Lindon Romp A, NP   20 mg at 08/28/17 2118  . gabapentin (NEURONTIN) capsule 300 mg  300 mg Oral TID Patrecia Pour, NP   300 mg at 08/29/17 6546  . hydrOXYzine (ATARAX/VISTARIL) tablet 25 mg  25 mg Oral Q6H PRN Lindon Romp A, NP      . loperamide (IMODIUM) capsule 2-4 mg  2-4 mg Oral PRN Lindon Romp A, NP      . magnesium hydroxide (MILK OF MAGNESIA) suspension 30 mL  30 mL Oral Daily PRN Patrecia Pour, NP      . methocarbamol (ROBAXIN) tablet 500 mg  500 mg Oral Q8H PRN Lindon Romp A, NP      . mometasone-formoterol (DULERA) 100-5 MCG/ACT inhaler 2 puff  2 puff Inhalation BID Patrecia Pour, NP   2 puff at 08/29/17 5035  . naproxen (NAPROSYN) tablet 500 mg  500 mg Oral BID PRN Rozetta Nunnery, NP      . ondansetron (ZOFRAN-ODT) disintegrating tablet 4 mg  4 mg Oral Q6H PRN Rozetta Nunnery, NP       PTA Medications: Medications Prior to Admission  Medication Sig Dispense Refill Last Dose  . albuterol (PROVENTIL HFA;VENTOLIN HFA) 108 (90 Base) MCG/ACT inhaler Inhale 2 puffs into the lungs every 6 (six) hours as needed for wheezing or shortness of breath. (Patient not taking: Reported on 08/28/2017) 1 Inhaler 2 Not Taking at Unknown time  . budesonide-formoterol (SYMBICORT) 80-4.5 MCG/ACT inhaler Inhale 2 puffs into the lungs 2 (two) times daily. (Patient not taking: Reported on 08/28/2017) 1 Inhaler 12 Not Taking at Unknown time  . predniSONE (DELTASONE) 10 MG tablet Take 6 tablets (60 mg total) by mouth daily with breakfast. Take 6 tabs tomorrow and then decrease by 1 tab daily until finished (Patient not taking: Reported on 08/28/2017) 21 tablet 0 Not Taking at Unknown time    Musculoskeletal: Strength & Muscle Tone: within normal limits Gait & Station: normal Patient leans: N/A  Psychiatric Specialty Exam:   Physical Exam  Nursing note and vitals reviewed. Constitutional: He is oriented to person, place, and time.   Neurological: He is alert and oriented to person, place, and time.    Review of Systems  Psychiatric/Behavioral: Positive for depression, substance abuse and suicidal ideas. Negative for hallucinations and memory loss. The patient is nervous/anxious and has insomnia.   All other systems reviewed and are negative.   Blood pressure 100/77, pulse 81, temperature 98.5 F (36.9 C), temperature source Oral, resp. rate 12, height 5' 7"  (1.702 m), weight 141 lb (64 kg), SpO2 98 %.Body mass index is 22.08 kg/m.  General Appearance: Fairly Groomed  Eye Contact:  Good  Speech:  Clear and Coherent and Normal Rate  Volume:  Normal  Mood:  Depressed and Irritable  Affect:  Depressed and Restricted  Thought Process:  Coherent, Goal Directed, Linear and Descriptions of Associations: Intact  Orientation:  Full (Time, Place, and Person)  Thought Content:  Denies AVH. No preoccupations or ruminations   Suicidal Thoughts:  Yes.  with intent/plan  Homicidal Thoughts:  No  Memory:  Immediate;   Fair Recent;   Fair  Judgement:  Impaired  Insight:  Shallow  Psychomotor Activity:  Normal  Concentration:  Concentration: Fair and Attention Span: Fair  Recall:  [employer] of Knowledge:  Fair  Language:  Good  Akathisia:  Negative  Handed:  Right  AIMS (if indicated):     Assets:  Communication Skills Desire for Improvement Resilience Social Support  ADL's:  Intact  Cognition:  WNL  Sleep:  Number of Hours: 7    Treatment Plan Summary: Daily contact with patient to assess and evaluate symptoms and progress in treatment  Treatment Plan/Recommendations: 1. Admit for crisis management and stabilization, estimated length of stay 3-5 days.  2. Medication management to reduce current symptoms to base line and improve the patient's overall level of functioning: See Md's SRATreatment plan.? 3. Treat health problems as indicated.  4. Develop treatment plan to decrease risk of relapse upon discharge and  the need for readmission.  5. Psycho-social education regarding relapse prevention and self care.  6. Health care follow up as needed for medical problems.  7. Review, reconcile, and reinstate any pertinent home medications for other health issues where appropriate. 8. Call for consults with hospitalist for any additional specialty patient care services as needed. 9. Begin Clonidine detox protocol for withdrawal symptoms.   Observation Level/Precautions:  15 minute checks  Laboratory:  Per, UDS (+) for opiates, Cocaine & benzodiazepines. Ordered TSH,HgbA1C and lipid panel.  Psychotherapy:  Group milieu   Medications:  See MAR  Consultations:  As needed.  Discharge Concerns:  Mood stability, maintaining sobriety & safety  Estimated LOS:2-4 days.  Other:  Admit to the 300-hall.    Physician Treatment Plan for Primary Diagnosis: Substance induced mood disorder ([place]) Long Term Goal(s): Improvement in symptoms so as ready for discharge  Short Term Goals: Ability to identify changes in lifestyle to reduce recurrence of condition will improve, Ability to verbalize feelings will improve, Compliance with prescribed medications will improve and Ability to identify triggers associated with substance abuse/mental health issues will improve  Physician Treatment Plan for Secondary Diagnosis: Principal Problem:   Substance induced mood disorder ([place]) Active Problems:   Major depressive disorder, single episode, severe without psychosis ([place])   Suicidal ideations  Long Term Goal(s): Improvement in symptoms so as ready for discharge  Short Term Goals: Ability to disclose and discuss suicidal ideas, Ability to demonstrate self-control will improve and Ability to identify and develop effective coping behaviors will improve  I certify that inpatient services furnished can reasonably be expected to improve the patient's condition.    Mordecai Maes, NP 11/4/201811:32 AM

## 2017-08-29 NOTE — [facility] Suicide Risk Assessment (Addendum)
[facility]   Nursing information obtained from:   patient and chart  Demographic factors:   46 year old single male, has two adult children, reports currently homeless, on disability Current Mental Status:   see below  Loss Factors:   homelessness, disability  Historical Factors:   depression, substance abuse  Risk Reduction Factors:   resilience   Total Time spent with patient: 45 minutes Principal Problem: Substance induced mood disorder (H[facility]) Diagnosis:   Patient Active Problem List   Diagnosis Date Noted  . Suicidal ideations [R45.851] 08/29/2017  . 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 Substance induced mood disorder ([facility]) [F19.94] 03/25/2017  . Polysubstance dependence including opioid type drug without complication, episodic abuse ([facility]) [F19.20] 03/25/2017  . Aspiration pneumonia ([facility]CC) [J69.0] 03/25/2017  . Delirium [R41.0]   . Ectopic atrial tachycardia (HCC) [I47.1]   . Acute hypercapnic respiratory failure (HCC) [J96.02] 03/22/2017  . COPD (chronic obstructive pulmonary disease) (HCC) [J44.9]   . Renal disorder [N28.9]      Continued Clinical Symptoms:  Alcohol Use Disorder Identification Test Final Score (AUDIT): 30 The "Alcohol Use Disorders Identification Test", Guidelines for Use in Primary Care, Second Edition.  World Science writerHealth Organization [facility](WHO). Score between 0-7:  no or low risk or alcohol related problems. Score between 8-15:  moderate risk of alcohol related problems. Score between 16-19:  high risk of alcohol related problems. Score 20 or above:  warrants further diagnostic evaluation for alcohol dependence and treatment.   CLINICAL FACTORS:  46 year old male. States he had been living with his mother over the last two years .  Reports he had recently relapsed on heroin and  accidentally overdosed . States he had been sober x 4 months          ( " except for a few beers here and there") until last week, when he relapsed on Heroin ( IV) .  He states that following relapse his mother kicked him out so that he is now homeless. States he had been mildly depressed prior to relapse but after relapse and being asked to leave mother's house he felt intensely depressed and " I did tried to OD on purpose ". UDS on admission positive for BZDs, Opiates, Cocaine. BAL negative . Of note, patient reports using heroin, but denies any BZD use .   He reports a prior psychiatric [place] in [place] for [place]. Reports prior history of suicide attempt by overdosing on drugs. Denies history of psychosis, denies history of mania. Denies history of violence .  States he had not been taking any medications prior to admission  Medical history is remarkable for COPD, states " I have a spot in my lungs and they think it could be cancer ".   Of note, at this time patient endorses symptoms suggestive of Opiate withdrawal- reports cramps, aches, nausea, diarrhea, rhinorrhea  Dx- Opiate Use Disorder, Opiate Induced Mood Disorder   Plan- Inpatient treatment . Clonidine detox protocol to minimize risk of WDL.  Patient is interested in antidepressant medication- as he reports history of chronic pain, would consider Cymbalta-start 20 mgrs QDAY  LFTs are mildly elevated- patient agrees with Hep B/C /HIV testing .      Musculoskeletal: Strength & Muscle Tone: within normal limits Gait & Station: normal Patient leans: N/A  Psychiatric Specialty Exam: Physical Exam  ROS denies headache,  reports muscular aches, cramps , reports nausea, vomiting x 1 earlier today, diarrhea.  Blood pressure 106/68, pulse 75, temperature 98.5 F (36.9 C), temperature source Oral, resp. rate 12, height 5\' 7"  (1.702 m), weight 64 kg (141 lb), SpO2 98 %.Body mass index is 22.08 kg/m.  General Appearance: Fairly  Groomed  Eye Contact:  Fair  Speech:  Normal Rate  Volume:  Decreased  Mood:  depressed, anxious   Affect:  anxious   Thought Process:  Linear and Descriptions of Associations: Intact  Orientation:  Other:  fully alert and attentive   Thought Content:  denies hallucinations, no delusions, not internally preoccupied   Suicidal Thoughts:  No denies any suicidal or self injurious ideations, denies homicidal ideations  Homicidal Thoughts:  No  Memory:  recent and remote grossly intact   Judgement:  Other:  fair   Insight:  Fair  Psychomotor Activity:  slilghtly restless  Concentration:  Concentration: Fair and Attention Span: Fair  Recall:  Fair  Fund of Knowledge:  Good  Language:  Good  Akathisia:  Negative  Handed:  Right  AIMS (if indicated):     Assets:  Desire for Improvement Resilience  ADL's:  Intact  Cognition:  WNL  Sleep:  Number of Hours: 7      COGNITIVE FEATURES THAT CONTRIBUTE TO RISK:  Closed-mindedness and Loss of executive function    SUICIDE RISK:   Moderate:  Frequent suicidal ideation with limited intensity, and duration, some specificity in terms of plans, no associated intent, good self-control, limited dysphoria/symptomatology, some risk factors present, and identifiable protective factors, including available and accessible social support.  PLAN OF CARE: Patient will be admitted to inpatient psychiatric unit for stabilization and safety. Will provide and encourage milieu participation. Provide medication management and maked adjustments as needed. Will also provide medication management to minimize symptoms of WDL.  Will follow daily.    I certify that inpatient services furnished can reasonably be expected to improve the patient's condition.   Craige Cotta, MD 08/29/2017, 3:08 PM

## 2017-08-29 NOTE — Progress Notes (Signed)
D. Pt presents with an anxious affect and congruent mood. Pt in bed upon initial approach -approx 8 am- and was denying the need for any medications.Pt came to med window with encouragement. At time of am meds, pt voiced no complaints and denied SI/HI and A/V hallucinations but verbally agreed to contact staff if this changed. Later upon completion of self inventory, pt reported his depression, hopelessness and anxiety, all a 10/10. Per pt's self inventory, pt reports having SI all the time, endorses poor sleep and withdrawal symptoms.Pt reports that his goal today is to "stay alive" and he will do this by "containing my thoughts". A. Labs and vitals monitored. Pt compliant with medications. Pt supported emotionally and encouraged to express concerns and ask questions.   R. Pt in bed resting with eyes closed at this time. Pt remains safe with 15 minute checks. Will continue POC.
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
## 2017-08-29 NOTE — [facility] Group Notes (Signed)
   [facility] LCSW Group Therapy Note    08/29/2017  At 10:10 to 11 AM   Type of Therapy and Topic: Group Therapy: Feelings Around Returning Home & Establishing a Supportive Framework and Activity to Identify signs of Improvement or Decompensation   Participation Level:  Did Not Attend; invited to participate yet did not despite overhead announcement and encouragement by staff    Carney Bernatherine C Harrill, LCSW

## 2017-08-29 NOTE — Progress Notes (Signed)
Patient ID: Lawrence Hanna, male   DOB: 12/20/1970, 46 y.o.   MRN: [id number]  D: Patient in dayroom on approach. Pt reports he is doing well but not sleeping. Pt mood and affect appeared depressed and flat. Pt reports he is tolerating medication well. Pt denies SI/HI/AVH and pain. Pt attended and engaged in evening AA group. Cooperative with assessment. No acute distressed noted at this time.   A: Medications administered as prescribed. Support and encouragement provided as needed. Pt encouraged to discuss feelings and come to staff with any question or concerns.   R: Patient remains safe and complaint with medications.

## 2017-08-30 LAB — HIV ANTIBODY (ROUTINE TESTING W REFLEX): HIV Screen 4th Generation wRfx: NONREACTIVE

## 2017-08-30 MED ORDER — QUETIAPINE FUMARATE 50 MG PO TABS
50.0000 mg | ORAL_TABLET | Freq: Every day | ORAL | Status: DC
  Administered 2017-08-30: 50 mg via ORAL
  Filled 2017-08-30 (×4): qty 1

## 2017-08-30 MED ORDER — DULOXETINE HCL 30 MG PO CPEP
30.0000 mg | ORAL_CAPSULE | Freq: Every day | ORAL | Status: DC
  Administered 2017-08-31 – 2017-09-01 (×2): 30 mg via ORAL
  Filled 2017-08-30 (×4): qty 1

## 2017-08-30 NOTE — Progress Notes (Signed)
Pt interviewed with Winfer[facility] Humphreyee Gray ([facility]). [facility] and does not want [facility].  Trula SladeHeather Smart, MSW, LCSW Clinical Social Worker 08/30/2017 2:36 PM

## 2017-08-30 NOTE — Plan of Care (Signed)
Patient verbalizes understanding of information, education provided. 

## 2017-08-30 NOTE — Tx Team (Signed)
Interdisciplinary Treatment and Diagnostic Plan Update  08/30/2017 Time of Session: 0830AM Lawrence ParaSteven Hanna MRN: [id number]  Principal Diagnosis: Substance induced mood disorder (HCC)  Secondary Diagnoses: Principal Problem:   Substance induced mood disorder (HCC) Active Problems:   Major depressive disorder, single episode, severe without psychosis (HCC)   Suicidal ideations   Current Medications:  Current Facility-Administered Medications  Medication Dose Route Frequency Provider Last Rate Last Dose  . acetaminophen (TYLENOL) tablet 650 mg  650 mg Oral Q6H PRN Charm RingsLord, Jamison Y, NP      . albuterol (PROVENTIL HFA;VENTOLIN HFA) 108 (90 Base) MCG/ACT inhaler 2 puff  2 puff Inhalation Q6H PRN Charm RingsLord, Jamison Y, NP      . alum & mag hydroxide-simeth (MAALOX/MYLANTA) 200-200-20 MG/5ML suspension 30 mL  30 mL Oral Q4H PRN Charm RingsLord, Jamison Y, NP      . cloNIDine (CATAPRES) tablet 0.1 mg  0.1 mg Oral QID Nira ConnBerry, Jason A, NP   0.1 mg at 08/30/17 0820   Followed by  . [START ON 08/31/2017] cloNIDine (CATAPRES) tablet 0.1 mg  0.1 mg Oral BH-qamhs Jackelyn PolingBerry, Jason A, NP       Followed by  . [START ON 09/03/2017] cloNIDine (CATAPRES) tablet 0.1 mg  0.1 mg Oral QAC breakfast Nira ConnBerry, Jason A, NP      . dicyclomine (BENTYL) tablet 20 mg  20 mg Oral Q6H PRN Nira ConnBerry, Jason A, NP   20 mg at 08/28/17 2118  . DULoxetine (CYMBALTA) DR capsule 20 mg  20 mg Oral Daily Cobos, Rockey SituFernando A, MD   20 mg at 08/30/17 0820  . gabapentin (NEURONTIN) capsule 100 mg  100 mg Oral TID Cobos, Rockey SituFernando A, MD   100 mg at 08/30/17 0820  . hydrOXYzine (ATARAX/VISTARIL) tablet 25 mg  25 mg Oral Q6H PRN Nira ConnBerry, Jason A, NP   25 mg at 08/29/17 1650  . loperamide (IMODIUM) capsule 2-4 mg  2-4 mg Oral PRN Nira ConnBerry, Jason A, NP      . magnesium hydroxide (MILK OF MAGNESIA) suspension 30 mL  30 mL Oral Daily PRN Charm RingsLord, Jamison Y, NP      . methocarbamol (ROBAXIN) tablet 500 mg  500 mg Oral Q8H PRN Jackelyn PolingBerry, Jason A, NP      . mometasone-formoterol (DULERA)  100-5 MCG/ACT inhaler 2 puff  2 puff Inhalation BID Charm RingsLord, Jamison Y, NP   2 puff at 08/30/17 0820  . naproxen (NAPROSYN) tablet 500 mg  500 mg Oral BID PRN Nira ConnBerry, Jason A, NP      . ondansetron (ZOFRAN-ODT) disintegrating tablet 4 mg  4 mg Oral Q6H PRN Nira ConnBerry, Jason A, NP      . traZODone (DESYREL) tablet 50 mg  50 mg Oral QHS,MR X 1 Nira ConnBerry, Jason A, NP   50 mg at 08/29/17 2209   PTA Medications: Medications Prior to Admission  Medication Sig Dispense Refill Last Dose  . albuterol (PROVENTIL HFA;VENTOLIN HFA) 108 (90 Base) MCG/ACT inhaler Inhale 2 puffs into the lungs every 6 (six) hours as needed for wheezing or shortness of breath. (Patient not taking: Reported on 08/28/2017) 1 Inhaler 2 Not Taking at Unknown time  . budesonide-formoterol (SYMBICORT) 80-4.5 MCG/ACT inhaler Inhale 2 puffs into the lungs 2 (two) times daily. (Patient not taking: Reported on 08/28/2017) 1 Inhaler 12 Not Taking at Unknown time  . predniSONE (DELTASONE) 10 MG tablet Take 6 tablets (60 mg total) by mouth daily with breakfast. Take 6 tabs tomorrow and then decrease by 1 tab daily until finished (Patient not taking:  Reported on 08/28/2017) 21 tablet 0 Not Taking at Unknown time    Patient Stressors: Financial difficulties Marital or family conflict Substance abuse  Patient Strengths: Barrister's clerk for treatment/growth  Treatment Modalities: Medication Management, Group therapy, Case management,  1 to 1 session with clinician, Psychoeducation, Recreational therapy.   Physician Treatment Plan for Primary Diagnosis: Substance induced mood disorder (HCC) Long Term Goal(s): Improvement in symptoms so as ready for discharge Improvement in symptoms so as ready for discharge   Short Term Goals: Ability to identify changes in lifestyle to reduce recurrence of condition will improve Ability to verbalize feelings will improve Compliance with prescribed medications will improve Ability to identify triggers  associated with substance abuse/mental health issues will improve Ability to disclose and discuss suicidal ideas Ability to demonstrate self-control will improve Ability to identify and develop effective coping behaviors will improve  Medication Management: Evaluate patient's response, side effects, and tolerance of medication regimen.  Therapeutic Interventions: 1 to 1 sessions, Unit Group sessions and Medication administration.  Evaluation of Outcomes: Progressing  Physician Treatment Plan for Secondary Diagnosis: Principal Problem:   Substance induced mood disorder (HCC) Active Problems:   Major depressive disorder, single episode, severe without psychosis (HCC)   Suicidal ideations  Long Term Goal(s): Improvement in symptoms so as ready for discharge Improvement in symptoms so as ready for discharge   Short Term Goals: Ability to identify changes in lifestyle to reduce recurrence of condition will improve Ability to verbalize feelings will improve Compliance with prescribed medications will improve Ability to identify triggers associated with substance abuse/mental health issues will improve Ability to disclose and discuss suicidal ideas Ability to demonstrate self-control will improve Ability to identify and develop effective coping behaviors will improve     Medication Management: Evaluate patient's response, side effects, and tolerance of medication regimen.  Therapeutic Interventions: 1 to 1 sessions, Unit Group sessions and Medication administration.  Evaluation of Outcomes: Progressing   RN Treatment Plan for Primary Diagnosis: Substance induced mood disorder (HCC) Long Term Goal(s): Knowledge of disease and therapeutic regimen to maintain health will improve  Short Term Goals: Ability to remain free from injury will improve, Ability to verbalize feelings will improve and Ability to disclose and discuss suicidal ideas  Medication Management: RN will administer  medications as ordered by provider, will assess and evaluate patient's response and provide education to patient for prescribed medication. RN will report any adverse and/or side effects to prescribing provider.  Therapeutic Interventions: 1 on 1 counseling sessions, Psychoeducation, Medication administration, Evaluate responses to treatment, Monitor vital signs and CBGs as ordered, Perform/monitor CIWA, COWS, AIMS and Fall Risk screenings as ordered, Perform wound care treatments as ordered.  Evaluation of Outcomes: Progressing   LCSW Treatment Plan for Primary Diagnosis: Substance induced mood disorder (HCC) Long Term Goal(s): Safe transition to appropriate next level of care at discharge, Engage patient in therapeutic group addressing interpersonal concerns.  Short Term Goals: Engage patient in aftercare planning with referrals and resources, Facilitate patient progression through stages of change regarding substance use diagnoses and concerns and Identify triggers associated with mental health/substance abuse issues  Therapeutic Interventions: Assess for all discharge needs, 1 to 1 time with Social worker, Explore available resources and support systems, Assess for adequacy in community support network, Educate family and significant other(s) on suicide prevention, Complete Psychosocial Assessment, Interpersonal group therapy.  Evaluation of Outcomes: Progressing   Progress in Treatment: Attending groups: Yes. Participating in groups: Yes. Taking medication as prescribed: Yes. Toleration medication:  Yes. Family/Significant other contact made: Patient declined collateral contact. SPE completed with pt.  Patient understands diagnosis: Yes. Discussing patient identified problems/goals with staff: Yes. Medical problems stabilized or resolved: Yes. Denies suicidal/homicidal ideation: Yes. Issues/concerns per patient self-inventory: No. Other: n/a   New problem(s) identified: No,  Describe:  n/a   New Short Term/Long Term Goal(s): medication management; detox; development of comprehensive mental wellness/sobriety plan.   Discharge Plan or Barriers: CSW assessing. Pt interested in residential or ALF placement.   Reason for Continuation of Hospitalization: Anxiety Depression Medication stabilization Suicidal ideation Withdrawal symptoms  Estimated Length of Stay: Wed 09/01/17  Attendees: Patient: 08/30/2017 9:21 AM  Physician: Dr. Altamese San Fernando MD; Dr. Jama Flavors MD 08/30/2017 9:21 AM  Nursing: Everardo Beals RN 08/30/2017 9:21 AM  RN Care Manager: Onnie Boer CM 08/30/2017 9:21 AM  Social Worker: Chartered loss adjuster, LCSW 08/30/2017 9:21 AM  Recreational Therapist: x 08/30/2017 9:21 AM  Other: Armandina Stammer NP; Reola Calkins NP; Feliz Beam Money NP 08/30/2017 9:21 AM  Other:  08/30/2017 9:21 AM  Other: 08/30/2017 9:21 AM    Scribe for Treatment Team: Ledell Peoples Smart, LCSW 08/30/2017 9:21 AM

## 2017-08-30 NOTE — Progress Notes (Signed)
[facility] MD Progress Note  08/30/2017 6:29 PM Lawrence Hanna  MRN:  [id number] Subjective:  Patient reports he is feeling partially better today, less depressed. Endorses some symptoms of opiate WDL ( myalgias, aches, nausea), but states better today than on admission.  At this time denies suicidal ideations. Patient reports he has been on Seroquel in the past and that this medication " helped me the most " . He is hoping to restart Seroquel, which he states helped stabilize his mood, treat depression , anxiety, and helped him sleep better. Denies medication side effects at present . Objective : I have discussed case with treatment team and have met with patient . 46 year old male, who reports history of opiate dependence, recent accidental overdose, and worsening depression recently. Currently describes mild to moderate symptoms of opiate WDL, but acknowledges he feels better today. Denies vomiting or diarrhea at this time, and has tolerated PO intake well .  Currently focused on starting Seroquel which he states he has been on before with good response and no side effects, for depression and insomnia.  No disruptive or agitated behaviors on unit. Going to some groups.   Principal Problem: Substance induced mood disorder ([place]) Diagnosis:   Patient Active Problem List   Diagnosis Date Noted  . Suicidal ideations [R45.851] 08/29/2017  . Major depressive disorder, single episode, severe without psychotic features ([place]) [F32.2] 08/28/2017  . Major depressive disorder, single episode, severe without psychosis ([place]) [F32.2] 08/28/2017  . Substance induced mood disorder ([place]) [F19.94] 03/25/2017  . Polysubstance dependence including opioid type drug without complication, episodic abuse ([place]) [F19.20] 03/25/2017  . Aspiration pneumonia ([place]) [J69.0] 03/25/2017  . Delirium [R41.0]   . Ectopic atrial tachycardia ([facility]) [I47.1]   . Acute hypercapnic respiratory failure ([place]) [J96.02] 03/22/2017  . COPD  (chronic obstructive pulmonary disease) ([place]) [J44.9]   . Renal disorder [N28.9]    Total Time spent with patient: 20 minutes  Past Medical History:  Past Medical History:  Diagnosis Date  . COPD (chronic obstructive pulmonary disease) ([place])   . Renal disorder    History reviewed. No pertinent surgical history. Family History:  Family History  Problem Relation Age of Onset  . Hypertension Father    Social History:  Social History   Substance and Sexual Activity  Alcohol Use No     Social History   Substance and Sexual Activity  Drug Use Not on file    Social History   Socioeconomic History  . Marital status: Single    Spouse name: None  . Number of children: None  . Years of education: None  . Highest education level: None  Social Needs  . Financial resource strain: None  . Food insecurity - worry: None  . Food insecurity - inability: None  . Transportation needs - medical: None  . Transportation needs - non-medical: None  Occupational History  . None  Tobacco Use  . Smoking status: Current Every Day Smoker    Types: Cigarettes  . Smokeless tobacco: Never Used  Substance and Sexual Activity  . Alcohol use: No  . Drug use: None  . Sexual activity: None  Other Topics Concern  . None  Social History Narrative  . None   Additional Social History:   Sleep: improving   Appetite:  fair- improving   Current Medications: Current Facility-Administered Medications  Medication Dose Route Frequency Provider Last Rate Last Dose  . acetaminophen (TYLENOL) tablet 650 mg  650 mg Oral Q6H PRN Patrecia Pour,  NP      . albuterol (PROVENTIL HFA;VENTOLIN HFA) 108 (90 Base) MCG/ACT inhaler 2 puff  2 puff Inhalation Q6H PRN Patrecia Pour, NP      . alum & mag hydroxide-simeth (MAALOX/MYLANTA) 200-200-20 MG/5ML suspension 30 mL  30 mL Oral Q4H PRN Patrecia Pour, NP      . cloNIDine (CATAPRES) tablet 0.1 mg  0.1 mg Oral QID Lindon Romp A, NP   0.1 mg at 08/30/17 1719    Followed by  . [START ON 08/31/2017] cloNIDine (CATAPRES) tablet 0.1 mg  0.1 mg Oral BH-qamhs Rozetta Nunnery, NP       Followed by  . [START ON 09/03/2017] cloNIDine (CATAPRES) tablet 0.1 mg  0.1 mg Oral QAC breakfast Lindon Romp A, NP      . dicyclomine (BENTYL) tablet 20 mg  20 mg Oral Q6H PRN Lindon Romp A, NP   20 mg at 08/28/17 2118  . DULoxetine (CYMBALTA) DR capsule 20 mg  20 mg Oral Daily Cobos, Myer Peer, MD   20 mg at 08/30/17 0820  . gabapentin (NEURONTIN) capsule 100 mg  100 mg Oral TID Cobos, Myer Peer, MD   100 mg at 08/30/17 1813  . hydrOXYzine (ATARAX/VISTARIL) tablet 25 mg  25 mg Oral Q6H PRN Lindon Romp A, NP   25 mg at 08/29/17 1650  . loperamide (IMODIUM) capsule 2-4 mg  2-4 mg Oral PRN Lindon Romp A, NP      . magnesium hydroxide (MILK OF MAGNESIA) suspension 30 mL  30 mL Oral Daily PRN Patrecia Pour, NP      . methocarbamol (ROBAXIN) tablet 500 mg  500 mg Oral Q8H PRN Rozetta Nunnery, NP      . mometasone-formoterol (DULERA) 100-5 MCG/ACT inhaler 2 puff  2 puff Inhalation BID Patrecia Pour, NP   2 puff at 08/30/17 0820  . naproxen (NAPROSYN) tablet 500 mg  500 mg Oral BID PRN Rozetta Nunnery, NP      . ondansetron (ZOFRAN-ODT) disintegrating tablet 4 mg  4 mg Oral Q6H PRN Rozetta Nunnery, NP      . traZODone (DESYREL) tablet 50 mg  50 mg Oral QHS,MR X 1 Lindon Romp A, NP   50 mg at 08/29/17 2209    Lab Results:  Results for orders placed or performed during the hospital encounter of 08/28/17 (from the past 48 hour(s))  HIV antibody     Status: None   Collection Time: 08/30/17  6:39 AM  Result Value Ref Range   HIV Screen 4th Generation wRfx Non Reactive Non Reactive    Comment: (NOTE) Performed At: [facility] [place], [place] [id number] Rush Farmer MD RJ:[id number] Performed at [facility], [place] [address], [place], [place] [address]     Blood Alcohol level:  Lab Results  Component Value Date   ETH <10  08/28/2017   ETH <5 09/32/3557    Metabolic Disorder Labs: No results found for: HGBA1C, MPG No results found for: PROLACTIN No results found for: CHOL, TRIG, HDL, CHOLHDL, VLDL, LDLCALC  Physical Findings: AIMS: Facial and Oral Movements Muscles of Facial Expression: None, normal Lips and Perioral Area: None, normal Jaw: None, normal Tongue: None, normal,Extremity Movements Upper (arms, wrists, hands, fingers): None, normal Lower (legs, knees, ankles, toes): None, normal, Trunk Movements Neck, shoulders, hips: None, normal, Overall Severity Severity of abnormal movements (highest score from questions above): None, normal Incapacitation due to abnormal movements: None, normal Patient's  awareness of abnormal movements (rate only patient's report): No Awareness, Dental Status Current problems with teeth and/or dentures?: No Does patient usually wear dentures?: No  CIWA:  CIWA-Ar Total: 8 COWS:  COWS Total Score: 4  Musculoskeletal: Strength & Muscle Tone: within normal limits Gait & Station: normal Patient leans: N/A  Psychiatric Specialty Exam: Physical Exam  ROS denies chest pain, no shortness of breath, no vomiting or diarrhea endorsed .  Blood pressure 111/70, pulse 63, temperature 98 F (36.7 C), temperature source Oral, resp. rate 12, height 5' 7"  (1.702 m), weight 64 kg (141 lb), SpO2 98 %.Body mass index is 22.08 kg/m.  General Appearance: Fairly Groomed  Eye Contact:  Good  Speech:  Normal Rate  Volume:  Normal  Mood:  improving  Affect:  more reactive , smiles appropriately at times   Thought Process:  Linear and Descriptions of Associations: Intact  Orientation:  Other:  fully alert and attentive   Thought Content:  denies hallucinations, no delusions, not internally preoccupied   Suicidal Thoughts:  No denies suicidal or self injurious ideations at this time , contracts for safety on unit, denies homicidal ideations  Homicidal Thoughts:  No  Memory:  recent  and remote grossly intact   Judgement:  Fair- improving   Insight:  Fair- improving   Psychomotor Activity:  Normal  Concentration:  Concentration: Good and Attention Span: Good  Recall:  Good  Fund of Knowledge:  Good  Language:  Good  Akathisia:  Negative  Handed:  Right  AIMS (if indicated):     Assets:  Desire for Improvement Resilience  ADL's:  Intact  Cognition:  WNL  Sleep:  Number of Hours: 6.75   Assessment - patient presents with improving mood and range of affect . Denies SI. Reports some lingering symptoms of opiate withdrawal, but is not in any acute distress or discomfort at this time, vitals are stable, and is tolerating PO intake well . Expresses interest in Seroquel, which he states he has taken before with good response ( for anxiety, depression, insomnia) and no side effects Treatment Plan Summary: Daily contact with patient to assess and evaluate symptoms and progress in treatment, Medication management, Plan inpatient treatment  and medications as below Encourage group and milieu participation to work on coping skills and symptom reduction  Continue to encourage efforts to work on sobriety and relapse prevention Treatment team working on disposition planning options. D/C Trazodone  Start Seroquel 50 mgrs QHS for mood disorder, insomnia- see above- side effects reviewed  Increase Cymbalta to 30 mgrs QDAY for depression, pain, anxiety Continue Neurontin 100 mgrs TID for anxiety, pain Continue Clonidine detox protocol to minimize symptoms of opiate [place], MD 08/30/2017, 6:29 PM

## 2017-08-30 NOTE — [facility] Group Notes (Signed)
[facility] 08/30/2017 1:15pm  Type of Therapy: Mental Health Association Presentation  Participation Level: Active  Participation Quality: Attentive  Affect: Appropriate  Cognitive: Oriented  Insight: Developing/Improving  Engagement in Therapy: Engaged  Modes of Intervention: Discussion, Education and Socialization  Summary of Progress/Problems: Mental Health Association (MHA) Speaker came to talk about his personal journey with mental health. The pt processed ways by which to relate to the speaker. MHA speaker provided handouts and educational information pertaining to groups and services offered by the [place] [facility]. Pt was engaged in speaker's presentation and was receptive to resources provided.    Pulte HomesHeather N Smart, LCSW 08/30/2017 3:12 PM

## 2017-08-30 NOTE — Progress Notes (Signed)
D: Patient observed isolative to room, bed. Frequent contacts made 1:1 throughout shift. Patient verbalizes to this Clinical research associatewriter he is still withdrawing. Patient's affect flat, mood depressed with some anxiety noted. Per self inventory and discussions with writer, rates depression, hopelessness and anxiety all at a 10/10. Rates sleep as poor, appetite as fair, energy as low and concentration as poor.  States goal for today is "treatment plan, talk to my counselor." Complaining of mild generalized pain (3/10). VSS. COWS is a "5" at 1700.  A: Medicated per orders, no prns requested or required. Level III obs in place for safety. Emotional support offered and self inventory reviewed. Encouraged completion of Suicide Safety Plan and programming participation. Discussed POC with MD, SW.   R: Patient verbalizes understanding of POC. Patient denies SI/HI/AVH and remains safe on level III obs. Will continue to monitor closely and make verbal contact frequently.

## 2017-08-30 NOTE — Progress Notes (Signed)
Recreation Therapy Notes  Date: 08/30/17 Time: 0930 Location: [address] Dayroom  Group Topic: Stress Management  Goal Area(s) Addresses:  Patient will verbalize importance of using healthy stress management.  Patient will identify positive emotions associated with healthy stress management.   Intervention: Stress Management  Activity :  Meditation.  LRT introduced the stress management technique of meditation.  LRT played a meditation on forgiveness of others to allow patients to explore their feelings.  Patients were to follow along as the meditation played to fully engage in the technique.  Education:  Stress Management, Discharge Planning.   Education Outcome: Acknowledges edcuation/In group clarification offered/Needs additional education  Clinical Observations/Feedback:  Pt did not attendgroup.   Caroll RancherMarjette Otillia Cordone, LRT/CTRS         Caroll RancherLindsay, Hydie Langan A 08/30/2017 11:48 AM

## 2017-08-30 NOTE — Progress Notes (Signed)
Patient ID: Lawrence Hanna, male   DOB: 12/31/1970, 46 y.o.   MRN: [id number]  Pt approaches MHT and RN reporting the he is unable to sleep due to his roomate's snoring. Pt is obviously agitated. Pt refused ear plugs or any further medications. Is currently sleeping in the quiet room. Was told that he is able to come and go freely as he stays in the room for the night. Verbalized understanding. Will continue to monitor.

## 2017-08-30 NOTE — Progress Notes (Signed)
Patient ID: Lawrence Hanna, male   DOB: 07/28/1971, 46 y.o.   MRN: [id number]   Pt currently presents with an agitated affect and labile behavior. Pt reports to Clinical research associatewriter that he wishes to take all of his medications at once tonight. Becomes visibly agitated when he realizes the Seroquel order is for 50 mg instead of 100 mg. Pt states "I am not going to be able to sleep with just 50 mg" Pt reports poor sleep with current medication regimen.   Pt provided with scheduled and as needed medications per providers orders. Pt's labs and vitals were monitored throughout the night. Pt given a 1:1 about emotional and mental status. Pt supported and encouraged to express concerns and questions. Pt educated on medications and effects on sleep.   Pt's safety ensured with 15 minute and environmental checks. Pt currently denies SI/HI and A/V hallucinations. Pt verbally agrees to seek staff if SI/HI or A/VH occurs and to consult with staff before acting on any harmful thoughts. Pt is currently still sleeping after taking the Seroquel and Vistaril, will continue POC.

## 2017-08-31 ENCOUNTER — Ambulatory Visit (HOSPITAL_COMMUNITY): Payer: Self-pay

## 2017-08-31 LAB — HEPATITIS C ANTIBODY

## 2017-08-31 LAB — HEPATITIS B SURFACE ANTIGEN: HEP B S AG: NEGATIVE

## 2017-08-31 MED ORDER — QUETIAPINE FUMARATE 100 MG PO TABS
150.0000 mg | ORAL_TABLET | Freq: Every day | ORAL | Status: DC
  Administered 2017-08-31 – 2017-09-01 (×2): 150 mg via ORAL
  Filled 2017-08-31 (×5): qty 1

## 2017-08-31 NOTE — Progress Notes (Signed)
Recreation Therapy Notes  Animal-Assisted Activity (AAA) Program Checklist/Progress Notes Patient Eligibility Criteria Checklist & Daily Group note for Rec TxIntervention  Date: 11.06.2018 Time: 2:45pm [address]: [address]   AAA/T Program Assumption of Risk Form signed by Patient/ or Parent Legal Guardian Yes  Patient is free of allergies or sever asthma Yes  Patient reports no fear of animals Yes  Patient reports no history of cruelty to animals Yes  Patient understands his/her participation is voluntary Yes  Behavioral Response: Did not attend.   Lawrence Hanna, LRT/CTRS         Vaun Hyndman L 08/31/2017 2:54 PM

## 2017-08-31 NOTE — BHH Group Notes (Signed)
LCSW Group Therapy Note   08/31/2017 1:15pm   Type of Therapy and Topic:  Group Therapy:  Overcoming Obstacles   Participation Level:  Active   Description of Group:    In this group patients will be encouraged to explore what they see as obstacles to their own wellness and recovery. They will be guided to discuss their thoughts, feelings, and behaviors related to these obstacles. The group will process together ways to cope with barriers, with attention given to specific choices patients can make. Each patient will be challenged to identify changes they are motivated to make in order to overcome their obstacles. This group will be process-oriented, with patients participating in exploration of their own experiences as well as giving and receiving support and challenge from other group members.   Therapeutic Goals: 1. Patient will identify personal and current obstacles as they relate to admission. 2. Patient will identify barriers that currently interfere with their wellness or overcoming obstacles.  3. Patient will identify feelings, thought process and behaviors related to these barriers. 4. Patient will identify two changes they are willing to make to overcome these obstacles:      Summary of Patient Progress   Viviann SpareSteven was attentive and engaged during today's processing group. He shared that his biggest obstacle is getting into placement from here. He is hoping to address his substance abuse issues and plans to attend [facility]. Patient is also interested in ALF placement if appropriate. He continues to show progress in the group setting with improving insight.    Therapeutic Modalities:   Cognitive Behavioral Therapy Solution Focused Therapy Motivational Interviewing Relapse Prevention Therapy  Ledell PeoplesHeather N Smart, LCSW 08/31/2017 3:11 PM

## 2017-08-31 NOTE — Progress Notes (Signed)
Pt is unable to [facility] or [facility] until next month because he has no money left from disability check for the month of November. Pt does not want referral to [facility]. He requested [facility] referral--[facility]: [id number]: [id number] from 08/31/17 to 09/06/17. Referral has been faxed ATTN Amy in admissions to [facility] 08/31/2017 10:47 AM   Trula SladeHeather Smart, MSW, LCSW Clinical Social Worker 08/31/2017 10:47 AM

## 2017-08-31 NOTE — Progress Notes (Signed)
Dar Note: Patient presents with irritable affect and mood.  Refused some of his morning medications.  Reports poor night sleep due to roommate snoring.  Refused auditory and visual hallucinations.  Reports irritability and anxiety as withdrawal symptoms.  Routine safety checks continues.  Patient visible in milieu with minimal interaction with staff and peers.  Patient is safe on the unit.

## 2017-08-31 NOTE — Progress Notes (Signed)
[facility] for Thursday at 10:00AM. Pt will be IVCed for safe transport. Admitting MD: Dr. Uvaldo RisingFaulkner.   Trula SladeHeather Smart, MSW, LCSW Clinical Social Worker 08/31/2017 3:11 PM

## 2017-08-31 NOTE — Progress Notes (Signed)
D:  Lawrence Hanna has been in his room most of the evening.  He did get up for medications but continues to refuse the clonidine, "I am not having withdrawals."  He denies any pain or discomfort and appears to be in no physical distress.   A:  1:1 interaction with RN for support and encouragement.  Medications as ordered.  Encouraged participation in group and unit activities.  Q 15 minute checks maintained for safety.   R:  Lawrence Hanna remains safe on the unit.  We will continue to monitor the progress towards his goals.

## 2017-08-31 NOTE — Final Progress Note (Signed)
CPSS met with Pt to make sure that he has a plan of care before being discharged. CPSS encouraged Pt to continue to stay focused and to work hard to better the quality of his life.

## 2017-08-31 NOTE — Progress Notes (Signed)
[facility] Progress Note  08/31/2017 6:09 PM [name]  MRN:  [id number] Subjective:  46 y.o Caucasian. Background history of SUD and mood disorder. Presented to the ER via the police. Expressed suicidal thoughts. Main stressor is being evicted from is mother's place. Use of substances has been affecting the quality of his relationship with his mother. Routine labs significant for mildly elevated liver enzymes.  UDS is positive for cocaine, opiates and benzodiazepines.  BAL is negative.  Chart reviewed today. Patient discussed at team today.  Staff reports that he has been irritable. He has attended some unit groups. No internal stimulation. Not expressing any violent thoughts. He has been accepted into [facility] for Thursday. Some relief with that news.   Seen today. Patient is pleased he has been accepted to [facility]. Says he wants to make sure he can smoke there. Patient says he needs more Seroquel. Has been taking 150 mg at home. Denies any current hallucination. Denies any current suicidal thoughts. No violent thoughts.     Principal Problem: Substance induced mood disorder (HCC) Diagnosis:   Patient Active Problem List   Diagnosis Date Noted  . Suicidal ideations [R45.851] 08/29/2017  . 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 Substance induced mood disorder (HCC) [F19.94] 03/25/2017  . Polysubstance dependence including opioid type drug without complication, episodic abuse (HCC) [F19.20] 03/25/2017  . Aspiration pneumonia (HCC) [J69.0] 03/25/2017  . Delirium [R41.0]   . Ectopic atrial tachycardia (HCC) [I47.1]   . Acute hypercapnic respiratory failure (HCC) [J96.02] 03/22/2017  . COPD (chronic obstructive pulmonary disease) (HCC) [J44.9]   . Renal disorder [N28.9]    Total Time spent with patient: 20 minutes  Past Psychiatric  History: As in H&P  Past Medical History:  Past Medical History:  Diagnosis Date  . COPD (chronic obstructive pulmonary disease) (HCC)   . Renal disorder    History reviewed. No pertinent surgical history. Family History:  Family History  Problem Relation Age of Onset  . Hypertension Father    Family Psychiatric  History: As in H&P Social History:  Social History   Substance and Sexual Activity  Alcohol Use No     Social History   Substance and Sexual Activity  Drug Use Not on file    Social History   Socioeconomic History  . Marital status: Single    Spouse name: None  . Number of children: None  . Years of education: None  . Highest education level: None  Social Needs  . Financial resource strain: None  . Food insecurity - worry: None  . Food insecurity - inability: None  . Transportation needs - medical: None  . Transportation needs - non-medical: None  Occupational History  . None  Tobacco Use  . Smoking status: Current Every Day Smoker    Types: Cigarettes  . Smokeless tobacco: Never Used  Substance and Sexual Activity  . Alcohol use: No  . Drug use: None  . Sexual activity: None  Other Topics Concern  . None  Social History Narrative  . None   Additional Social History:                         Sleep: Fair  Appetite:  Fair  Current Medications: Current Facility-Administered Medications  Medication Dose Route Frequency Provider Last Rate Last Dose  . acetaminophen (TYLENOL) tablet 650 mg  650 mg Oral Q6H PRN Charm Rings, NP      . albuterol (PROVENTIL HFA;VENTOLIN HFA) 108 (90 Base) MCG/ACT inhaler 2 puff  2 puff Inhalation Q6H PRN Charm Rings, NP      . alum & mag hydroxide-simeth (MAALOX/MYLANTA) 200-200-20 MG/5ML suspension 30 mL  30 mL Oral Q4H PRN Charm Rings, NP      . cloNIDine (CATAPRES) tablet 0.1 mg  0.1 mg Oral BH-qamhs Jackelyn Poling, NP       Followed by  . [START ON 09/03/2017] cloNIDine (CATAPRES) tablet 0.1  mg  0.1 mg Oral QAC breakfast Nira Conn A, NP      . dicyclomine (BENTYL) tablet 20 mg  20 mg Oral Q6H PRN Nira Conn A, NP   20 mg at 08/28/17 2118  . DULoxetine (CYMBALTA) DR capsule 30 mg  30 mg Oral Daily Cobos, Rockey Situ, MD   30 mg at 08/31/17 1124  . gabapentin (NEURONTIN) capsule 100 mg  100 mg Oral TID Cobos, Rockey Situ, MD   100 mg at 08/31/17 1651  . hydrOXYzine (ATARAX/VISTARIL) tablet 25 mg  25 mg Oral Q6H PRN Nira Conn A, NP   25 mg at 08/31/17 1313  . loperamide (IMODIUM) capsule 2-4 mg  2-4 mg Oral PRN Nira Conn A, NP      . magnesium hydroxide (MILK OF MAGNESIA) suspension 30 mL  30 mL Oral Daily PRN Charm Rings, NP      . methocarbamol (ROBAXIN) tablet 500 mg  500 mg Oral Q8H PRN Jackelyn Poling, NP      . mometasone-formoterol (DULERA) 100-5 MCG/ACT inhaler 2 puff  2 puff Inhalation BID Charm Rings, NP   2 puff at 08/30/17 0820  . naproxen (NAPROSYN) tablet 500 mg  500 mg Oral BID PRN Jackelyn Poling, NP      . ondansetron (ZOFRAN-ODT) disintegrating tablet 4 mg  4 mg Oral Q6H PRN Nira Conn A, NP      . QUEtiapine (SEROQUEL) tablet 50 mg  50 mg Oral QHS Cobos, Rockey Situ, MD   50 mg at 08/30/17 2108    Lab Results:  Results for orders placed or performed during the hospital encounter of 08/28/17 (from the past 48 hour(s))  Hepatitis B surface antigen     Status: None   Collection Time: 08/30/17  6:39 AM  Result Value Ref Range   Hepatitis B Surface Ag Negative Negative    Comment: (NOTE) Performed At: [facility] [address] [place], [place] [id number] Jolene Schimke MD WU:[id number] Performed at [facility], [address] [place], [place] [address]   Hepatitis C antibody     Status: None   Collection Time: 08/30/17  6:39 AM  Result Value Ref Range   HCV Ab <0.1 0.0 - 0.9 s/co ratio    Comment: (NOTE)                                  Negative:     < 0.8                             Indeterminate: 0.8 - 0.9                                   Positive:     >  0.9 The CDC recommends that a positive HCV antibody result be followed up with a HCV Nucleic Acid Amplification test ([id number]). Performed At: [facility] [address] [place], [place] [id number] Jolene SchimkeNagendra Sanjai MD BJ:[id number]:[phone] Performed at [facility], [address] [place], [place] [address]   HIV antibody     Status: None   Collection Time: 08/30/17  6:39 AM  Result Value Ref Range   HIV Screen 4th Generation wRfx Non Reactive Non Reactive    Comment: (NOTE) Performed At: [facility] [address] [place], [place] [id number] Jolene SchimkeNagendra Sanjai MD WU:[id number]:[phone] Performed at [facility], [address] [place], [place] [address]     Blood Alcohol level:  Lab Results  Component Value Date   ETH <10 08/28/2017   ETH <5 03/21/2017    Metabolic Disorder Labs: No results found for: HGBA1C, MPG No results found for: PROLACTIN No results found for: CHOL, TRIG, HDL, CHOLHDL, VLDL, LDLCALC  Physical Findings: AIMS: Facial and Oral Movements Muscles of Facial Expression: None, normal Lips and Perioral Area: None, normal Jaw: None, normal Tongue: None, normal,Extremity Movements Upper (arms, wrists, hands, fingers): None, normal Lower (legs, knees, ankles, toes): None, normal, Trunk Movements Neck, shoulders, hips: None, normal, Overall Severity Severity of abnormal movements (highest score from questions above): None, normal Incapacitation due to abnormal movements: None, normal Patient's awareness of abnormal movements (rate only patient's report): No Awareness, Dental Status Current problems with teeth and/or dentures?: No Does patient usually wear dentures?: No  CIWA:  CIWA-Ar Total: 8 COWS:  COWS Total Score: 3  Musculoskeletal: Strength & Muscle Tone: within normal limits Gait & Station: normal Patient leans: N/A  Psychiatric Specialty Exam: Physical Exam   Constitutional: He is oriented to person, place, and time. He appears well-developed and well-nourished.  HENT:  Head: Normocephalic and atraumatic.  Respiratory: Effort normal.  Neurological: He is alert and oriented to person, place, and time.  Psychiatric:  As above    ROS  Blood pressure 125/72, pulse 62, temperature 98.2 F (36.8 C), resp. rate 18, height 5\' 7"  (1.702 m), weight 64 kg (141 lb), SpO2 98 %.Body mass index is 22.08 kg/m.  General Appearance: Tense and anxious looking. Moderate rapport.   Eye Contact:  Good  Speech:  Clear and Coherent and Normal Rate  Volume:  Normal  Mood:  Anxious and Irritable  Affect:  Congruent  Thought Process:  Linear  Orientation:  Full (Time, Place, and Person)  Thought Content:  Future oriented. No delusional theme. No preoccupation with violent thoughts. No negative ruminations. No obsession.  No hallucination in any modality.   Suicidal Thoughts:  No  Homicidal Thoughts:  No  Memory:  Immediate;   Fair Recent;   Fair Remote;   Fair  Judgement:  Fair  Insight:  Good  Psychomotor Activity:  Normal  Concentration:  Concentration: Good and Attention Span: Good  Recall:  Good  Fund of Knowledge:  Good  Language:  Good  Akathisia:  Negative  Handed:    AIMS (if indicated):     Assets:  Communication Skills Desire for Improvement Physical Health Resilience  ADL's:  Intact  Cognition:  WNL  Sleep:  Number of Hours: 6.75     Treatment Plan Summary:  Patient is coming off multiple psychoactive substances. He still has some underlying irritability. He has been accepted into [facility]. We have agreed to optimize his mood stabilizer today.  Psychiatric: SIMD MDD  Medical: COPD  Psychosocial:  Homelessness Limited support.   PLAN: 1. Increase Seroquel to 150 mg at bedtime 2. Encourage unit groups and activities 3. Continue to monitor mood, behavior and interaction with peers Georgiann Cocker, MD 08/31/2017, 6:09 PM

## 2017-08-31 NOTE — Progress Notes (Signed)
Adult Psychoeducational Group Note  Date:  08/31/2017 Time:  4:16 AM  Group Topic/Focus:  Wrap-Up Group:   The focus of this group is to help patients review their daily goal of treatment and discuss progress on daily workbooks.  Participation Level:  Did Not Attend  Participation Quality:  Did Not Attend  Affect:  Did Not Attend  Cognitive:  Did Not Attend  Insight: None  Engagement in Group:  Did Not Attend  Modes of Intervention:  Did Not Attend  Additional Comments:  Pt did not attend the AA group this evening.  Felipa FurnaceChristopher  Jequan Shahin 08/31/2017, 4:16 AM

## 2017-09-01 ENCOUNTER — Other Ambulatory Visit: Payer: Self-pay

## 2017-09-01 MED ORDER — DULOXETINE HCL 30 MG PO CPEP: 30.0000 mg | ORAL_CAPSULE | Freq: Every day | ORAL | 0 refills | Status: DC

## 2017-09-01 MED ORDER — QUETIAPINE FUMARATE 50 MG PO TABS: 150.0000 mg | ORAL_TABLET | Freq: Every day | ORAL | 0 refills | Status: DC

## 2017-09-01 MED ORDER — MOMETASONE FURO-FORMOTEROL FUM 100-5 MCG/ACT IN AERO: 2.0000 | INHALATION_SPRAY | Freq: Two times a day (BID) | RESPIRATORY_TRACT | 0 refills | Status: DC

## 2017-09-01 MED ORDER — HYDROXYZINE HCL 25 MG PO TABS: 25.0000 mg | ORAL_TABLET | Freq: Four times a day (QID) | ORAL | 0 refills | Status: DC | PRN

## 2017-09-01 MED ORDER — GABAPENTIN 100 MG PO CAPS: 100.0000 mg | ORAL_CAPSULE | Freq: Three times a day (TID) | ORAL | 0 refills | Status: DC

## 2017-09-01 MED ORDER — HYDROXYZINE HCL 50 MG PO TABS
50.0000 mg | ORAL_TABLET | Freq: Once | ORAL | Status: AC
  Administered 2017-09-01: 50 mg via ORAL
  Filled 2017-09-01 (×2): qty 1

## 2017-09-01 MED ORDER — ALBUTEROL SULFATE HFA 108 (90 BASE) MCG/ACT IN AERS: 2.0000 | INHALATION_SPRAY | Freq: Four times a day (QID) | RESPIRATORY_TRACT | 0 refills | Status: AC | PRN

## 2017-09-01 NOTE — [facility] Group Notes (Signed)
[facility] Mental Health Association Group Therapy 09/01/2017 1:15pm  Type of Therapy: Mental Health Association Presentation  Participation Level: Active  Participation Quality: Attentive  Affect: Appropriate  Cognitive: Oriented  Insight: Developing/Improving  Engagement in Therapy: Engaged  Modes of Intervention: Discussion, Education and Socialization  Summary of Progress/Problems: Mental Health Association (MHA) Speaker came to talk about his personal journey with mental health. The pt processed ways by which to relate to the speaker. MHA speaker provided handouts and educational information pertaining to groups and services offered by the [facility]. Pt was engaged in speaker's presentation and was receptive to resources provided.    Pulte HomesHeather N Smart, LCSW 09/01/2017 4:04 PM

## 2017-09-01 NOTE — Progress Notes (Signed)
Patient ID: Lawrence Hanna, male   DOB: 07/07/1971, 46 y.o.   MRN: [id number]  MHT [name] came to writer this morning and informed Clinical research associatewriter that patient refused to get OOB for his morning medications. Writer went to patient's room to offer encouragement and support. Patient is polite and respectful however refuses his medications this morning. Writer encouraged patient to let staff know if he changes his mind. Patient verbalized understanding.

## 2017-09-01 NOTE — Progress Notes (Signed)
Recreation Therapy Notes  Date: 09/01/17 Time: 0930 Location: 300 [address] Dayroom  Group Topic: Stress Management  Goal Area(s) Addresses:  Patient will verbalize importance of using healthy stress management.  Patient will identify positive emotions associated with healthy stress management.   Intervention: Stress Management  Activity :  Guided Imagery.  LRT introduced the stress management technique of guided imagery.  LRT read a script to allow patients to take a mental vacation to escape their daily routine for a few minutes.  Education: Stress Management, Discharge Planning.   Education Outcome: Acknowledges edcuation/In group clarification offered/Needs additional education  Clinical Observations/Feedback: Pt did not attend group.    Caroll RancherMarjette Logon [place], LRT/CTRS          Caroll RancherLindsay, Glyn Gerads A 09/01/2017 11:58 AM

## 2017-09-01 NOTE — Progress Notes (Signed)
  [facility] Discharge Plan :  Will you be returning to the same living situation after discharge:  No.Pt accepted to [facility] for Thursday.  At discharge, do you have transportation home?: Yes,  [employer] will transport patient Thursday morning to [facility]. Do you have the ability to pay for your medications: Yes,  [facility]  Release of information consent forms completed and submitted to medical records by CSW.  Patient to Follow up at: Follow-up Information    [facility].   Why:  You have been accepted to [facility] for Thursday 08/02/17 at 10:30AM. Admitting MD: Dr. Uvaldo RisingFaulkner. [employer] will transport you directly to the facility for your safety. Thank you.  Contact information: [address] [place] [address] [phone]           Next level of care provider has access to [facility] Link:no  Safety Planning and Suicide Prevention discussed: Yes,  SPE completed with pt; pt declined to consent to family contact.   Have you used any form of tobacco in the last 30 days? (Cigarettes, Smokeless Tobacco, Cigars, and/or Pipes): Yes  Has patient been referred to the Quitline?: Patient refused referral  Patient has been referred for addiction treatment: Yes  Pulte HomesHeather N Smart, LCSW 09/01/2017, 1:13 PM

## 2017-09-01 NOTE — Progress Notes (Signed)
IVC findings delivered this afternoon. Officer incorrectly filled out paperwork. IVC paperwork resent to the magistrate to be re-served. Section A and B must be completed; C and D must be blank in order for [employer] to pick patient up for transport to [facility] in the AM. [facility] team and [facility] made aware of above.  Trula SladeHeather Smart, MSW, LCSW Clinical Social Worker 09/01/2017 4:15 PM

## 2017-09-01 NOTE — Progress Notes (Signed)
IVC paperwork faxed to magistrate. In review at this time/CSW called to confirm they were received. CSW spoke with [employer] [place]. He asked that CSW call when pt is served this afternoon and will pick up patient in the morning [facility].   Trula SladeHeather Smart, MSW, LCSW Clinical Social Worker 09/01/2017 10:57 AM

## 2017-09-01 NOTE — Progress Notes (Signed)
Adult Psychoeducational Group Note  Date:  09/01/2017 Time:  2:43 AM  Group Topic/Focus:  Wrap-Up Group:   The focus of this group is to help patients review their daily goal of treatment and discuss progress on daily workbooks.  Participation Level:  Did Not Attend  Participation Quality:  Did Not Attend  Affect:  Did Not Attend  Cognitive:  Did Not Attend  Insight: None  Engagement in Group:  Did Not Attend  Modes of Intervention:  Did Not Attend  Additional Comments:  Pt did not attend wrap up group this evening.  Felipa FurnaceChristopher  Mancel Lardizabal 09/01/2017, 2:43 AM

## 2017-09-01 NOTE — Progress Notes (Signed)
[facility]  09/01/2017 1:01 PM Lawrence Hanna  MRN:  [id number]   Subjective:  Patient reports that he just feels a little bad and states he thinks its due to withdrawals. He denies any tremors, headaches, nausea, or vomiting. Patient denies any SI/HI/AVH and contracts for safety. He states taht he is ready to go to treatment and get better.  Objective: Patient's chart and findings reviewed and discussed with treatment team. Patient has a flat affect, but is cooperative. He appears anxious but ready to discharge to [facility] tomorrow. Patient will be discharged in the morning to [facility].  Principal Problem: Substance induced mood disorder (HCC) Diagnosis:   Patient Active Problem List   Diagnosis Date Noted  . Suicidal ideations [R45.851] 08/29/2017  . 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 Substance induced mood disorder (HCC) [F19.94] 03/25/2017  . Polysubstance dependence including opioid type drug without complication, episodic abuse (HCC) [F19.20] 03/25/2017  . Aspiration pneumonia (HCC) [J69.0] 03/25/2017  . Delirium [R41.0]   . Ectopic atrial tachycardia (HCC) [I47.1]   . Acute hypercapnic respiratory failure (HCC) [J96.02] 03/22/2017  . COPD (chronic obstructive pulmonary disease) (HCC) [J44.9]   . Renal disorder [N28.9]    Total Time spent with patient: 15 minutes  Past Psychiatric History: See H&P  Past Medical History:  Past Medical History:  Diagnosis Date  . COPD (chronic obstructive pulmonary disease) (HCC)   . Renal disorder    History reviewed. No pertinent surgical history. Family History:  Family History  Problem Relation Age of Onset  . Hypertension Father    Family Psychiatric  History: See H&P Social History:  Social History   Substance and Sexual Activity  Alcohol Use No     Social History    Substance and Sexual Activity  Drug Use Not on file    Social History   Socioeconomic History  . Marital status: Single    Spouse name: None  . Number of children: None  . Years of education: None  . Highest education level: None  Social Needs  . Financial resource strain: None  . Food insecurity - worry: None  . Food insecurity - inability: None  . Transportation needs - medical: None  . Transportation needs - non-medical: None  Occupational History  . None  Tobacco Use  . Smoking status: Current Every Day Smoker    Types: Cigarettes  . Smokeless tobacco: Never Used  Substance and Sexual Activity  . Alcohol use: No  . Drug use: None  . Sexual activity: None  Other Topics Concern  . None  Social History Narrative  . None   Additional Social History:                         Sleep: Good  Appetite:  Good  Current Medications: Current Facility-Administered Medications  Medication Dose Route Frequency Provider Last Rate Last Dose  . acetaminophen (TYLENOL) tablet 650 mg  650 mg Oral Q6H PRN Charm RingsLord, Jamison Y, NP      . albuterol (PROVENTIL HFA;VENTOLIN HFA) 108 (90 Base) MCG/ACT inhaler 2 puff  2 puff Inhalation Q6H PRN Charm RingsLord, Jamison Y, NP      . alum & mag hydroxide-simeth (MAALOX/MYLANTA) 200-200-20 MG/5ML suspension 30 mL  30 mL Oral Q4H PRN Charm RingsLord, Jamison Y, NP      . cloNIDine (CATAPRES) tablet 0.1 mg  0.1 mg Oral BH-qamhs Jackelyn PolingBerry, Jason A, NP       Followed by  . [START ON 09/03/2017] cloNIDine (CATAPRES) tablet 0.1 mg  0.1 mg Oral QAC breakfast Nira ConnBerry, Jason A, NP      . dicyclomine (BENTYL) tablet 20 mg  20 mg Oral Q6H PRN Nira ConnBerry, Jason A, NP   20 mg at 08/28/17 2118  . DULoxetine (CYMBALTA) DR capsule 30 mg  30 mg Oral Daily Cobos, Rockey SituFernando A, MD   30 mg at 09/01/17 1147  . gabapentin (NEURONTIN) capsule 100 mg  100 mg Oral TID Cobos, Rockey SituFernando A, MD   100 mg at 09/01/17 1145  . hydrOXYzine (ATARAX/VISTARIL) tablet 25 mg  25 mg Oral Q6H PRN Nira ConnBerry, Jason A,  NP   25 mg at 09/01/17 1145  . loperamide (IMODIUM) capsule 2-4 mg  2-4 mg Oral PRN Nira ConnBerry, Jason A, NP      . magnesium hydroxide (MILK OF MAGNESIA) suspension 30 mL  30 mL Oral Daily PRN Charm RingsLord, Jamison Y, NP      . methocarbamol (ROBAXIN) tablet 500 mg  500 mg Oral Q8H PRN Jackelyn PolingBerry, Jason A, NP      . mometasone-formoterol (DULERA) 100-5 MCG/ACT inhaler 2 puff  2 puff Inhalation BID Charm RingsLord, Jamison Y, NP   2 puff at 09/01/17 1145  . naproxen (NAPROSYN) tablet 500 mg  500 mg Oral BID PRN Nira ConnBerry, Jason A, NP      . ondansetron (ZOFRAN-ODT) disintegrating tablet 4 mg  4 mg Oral Q6H PRN Nira ConnBerry, Jason A, NP      . QUEtiapine (SEROQUEL) tablet 150 mg  150 mg Oral QHS Izediuno, Delight OvensVincent A, MD   150 mg at 08/31/17 2134    Lab Results: No results found for this or any previous visit (from the past 48 hour(s)).  Blood Alcohol level:  Lab Results  Component Value Date   ETH <10 08/28/2017   ETH <5 03/21/2017    Metabolic Disorder Labs: No results found for: HGBA1C, MPG No results found for: PROLACTIN No results found for: CHOL, TRIG, HDL, CHOLHDL, VLDL, LDLCALC  Physical Findings: AIMS: Facial and Oral Movements Muscles of Facial Expression: None, normal Lips and Perioral Area: None, normal Jaw: None, normal Tongue: None, normal,Extremity Movements Upper (arms, wrists, hands, fingers): None, normal Lower (legs, knees, ankles, toes): None, normal, Trunk Movements Neck, shoulders, hips: None, normal, Overall Severity Severity of abnormal movements (highest score from questions above): None, normal Incapacitation due to abnormal movements: None, normal Patient's awareness of abnormal movements (rate only patient's report): No Awareness, Dental Status Current problems with teeth and/or dentures?: No Does patient usually wear dentures?: No  CIWA:  CIWA-Ar Total: 8 COWS:  COWS Total Score: 0  Musculoskeletal: Strength & Muscle Tone: within normal limits Gait & Station: normal Patient leans:  N/A  Psychiatric Specialty Exam: Physical Exam  Nursing note and vitals reviewed. Constitutional: He is oriented to person, place, and time. He appears well-developed and well-nourished.  Cardiovascular: Normal rate.  Respiratory: Effort normal.  Musculoskeletal: Normal range of motion.  Neurological: He is alert and oriented to person, place, and time.  Skin: Skin is warm.    Review of Systems  Constitutional: Negative.   HENT: Negative.   Eyes: Negative.   Respiratory: Negative.   Cardiovascular: Negative.   Gastrointestinal: Negative.   Genitourinary: Negative.   Musculoskeletal: Negative.   Skin: Negative.   Neurological: Negative.   Endo/Heme/Allergies: Negative.   Psychiatric/Behavioral: Negative for hallucinations and suicidal ideas. The patient is  nervous/anxious.     Blood pressure 120/76, pulse 76, temperature 98.4 F (36.9 C), temperature source Oral, resp. rate 16, height 5\' 7"  (1.702 m), weight 64 kg (141 lb), SpO2 98 %.Body mass index is 22.08 kg/m.  General Appearance: Casual  Eye Contact:  Good  Speech:  Clear and Coherent  Volume:  Normal  Mood:  Anxious  Affect:  Congruent  Thought Process:  Goal Directed and Descriptions of Associations: Intact  Orientation:  Full (Time, Place, and Person)  Thought Content:  WDL  Suicidal Thoughts:  No  Homicidal Thoughts:  No  Memory:  Immediate;   Good Recent;   Good Remote;   Good  Judgement:  Fair  Insight:  Fair  Psychomotor Activity:  Normal  Concentration:  Concentration: Good and Attention Span: Good  Recall:  Good  Fund of Knowledge:  Good  Language:  Good  Akathisia:  No  Handed:  Right  AIMS (if indicated):     Assets:  Communication Skills Desire for Improvement Financial Resources/Insurance Housing Social Support Transportation  ADL's:  Intact  Cognition:  WNL  Sleep:  Number of Hours: 5.5   Problems Addressed: SUD MDD severe SI  Treatment Plan Summary: Daily contact with patient to  assess and evaluate symptoms and progress in treatment, Medication management and Plan is to:  -Continue Clonidine Protocol -Continue Cymbalta 30 mg PO Daily for mood stability -Continue Gabapentin 100 mg PO TID for withdrawal symptoms -Continue Seroquel 150 mg PO QHS for mood stability -Encourage group therapy participation -Discharge tomorrow to [facility]  Maryfrances Bunnell, FNP 09/01/2017, 1:01 PM

## 2017-09-01 NOTE — Progress Notes (Signed)
Patient ID: Lawrence ParaSteven Delsignore, male   DOB: 08/14/1971, 46 y.o.   MRN: [id number]  DAR: Pt. Denies SI/HI and A/V Hallucinations. He reports that his sleep last night was poor, his appetite is fair, his energy level is low, and concentration is good. He rates his depression and hopelessness level is 8/10.  Patient does not report any pain or discomfort at this time. Scheduled medications and PRN medications administered to patient per physician's orders. Patient is initially guarded and unwilling to participate this morning but appears more visible in the milieu and more interactive with peers. Patient appears anxious and worried about [facility] tomorrow. Support and encouragement provided to patient. Q15 minute checks are maintained for safety.

## 2017-09-01 NOTE — Progress Notes (Signed)
D    Pt refused detox medications but said he is feeling fine   He just wanted his seroquel for sleep   His interactions are appropriate   He can be irritable at times   He will be discharged tomorrow to go to [facility]  A    Verbal support given   Medications administered and effectiveness monitored    Q 15 min checks    R    Pt is safe at present time

## 2017-09-01 NOTE — Discharge Summary (Signed)
Physician Discharge Summary Note  Patient:  Lawrence Hanna is an 46 y.o., male MRN:  [id number] DOB:  03/03/1971 Patient phone:  [phone] (home)  Patient address:   [place] [place] [id number],  Total Time spent with patient: 20 minutes  Date of Admission:  08/28/2017 Date of Discharge: 09/02/17   Reason for Admission: Worsening depression with substance abuse   Principal Problem: Substance induced mood disorder [facility](HCC) Discharge Diagnoses: Patient Active Problem List   Diagnosis Date Noted  . Suicidal ideations [R45.851] 08/29/2017  . 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 Substance induced mood disorder (HCC) [F19.94] 03/25/2017  . Polysubstance dependence including opioid type drug without complication, episodic abuse (HCC) [F19.20] 03/25/2017  . Aspiration pneumonia (HCC) [J69.0] 03/25/2017  . Delirium [R41.0]   . Ectopic atrial tachycardia (HCC) [I47.1]   . Acute hypercapnic respiratory failure (HCC) [J96.02] 03/22/2017  . COPD (chronic obstructive pulmonary disease) (HCC) [J44.9]   . Renal disorder [N28.9]     Past Psychiatric History: Substance abuse, SI, Suicide attempt  Past Medical History:  Past Medical History:  Diagnosis Date  . COPD (chronic obstructive pulmonary disease) (HCC)   . Renal disorder    History reviewed. No pertinent surgical history. Family History:  Family History  Problem Relation Age of Onset  . Hypertension Father    Family Psychiatric  History: Unknown Social History:  Social History   Substance and Sexual Activity  Alcohol Use No     Social History   Substance and Sexual Activity  Drug Use Not on file    Social History   Socioeconomic History  . Marital status: Single    Spouse name: None  . Number of children: None  . Years of education: None  .  Highest education level: None  Social Needs  . Financial resource strain: None  . Food insecurity - worry: None  . Food insecurity - inability: None  . Transportation needs - medical: None  . Transportation needs - non-medical: None  Occupational History  . None  Tobacco Use  . Smoking status: Current Every Day Smoker    Types: Cigarettes  . Smokeless tobacco: Never Used  Substance and Sexual Activity  . Alcohol use: No  . Drug use: None  . Sexual activity: None  Other Topics Concern  . None  Social History Narrative  . None    Hospital Course:   [facility]: 46 year old male. States he had been living with his mother over the last two years .  Reports he had recently relapsed on heroin and accidentally overdosed . States he had been sober x 4 months          ( " except for a few beers here and there") until last week, when he relapsed on Heroin ( IV) .  He states that following relapse his mother kicked him out so that he is now homeless. States he had been mildly depressed prior to relapse but after relapse and being asked to leave mother's house he felt intensely depressed and " I did tried to OD on purpose ". UDS on admission positive for BZDs, Opiates, Cocaine. BAL negative . Of note, patient reports using heroin, but denies any BZD use .  He [place]. Reports prior history of suicide attempt by overdosing on drugs. Denies history  of psychosis, denies history of mania. Denies history of violence . States he had not been taking any medications prior to admission. Medical history is remarkable for COPD, states " I have a spot in my lungs and they think it could be cancer ". Of note, at this time patient endorses symptoms suggestive of Opiate withdrawal- reports cramps, aches, nausea, diarrhea, rhinorrhea.  [facility] and stabilized with medications and therapy. Patient was started on  Cymbalta 30 mg Daily, Gabapentin 100 mg TID, Seroquel 150 mg QHS, Vistaril 25 mg Q6H PRN for anxiety, was on Clonidine Detox Protocol, but he refused his medications the last 2 days and states he doesn't needs it since he is leaving for [facility]. Patient has shown improvement with improved sleep, appetite, interaction, and mood. Patient denies any withdrawal symptoms. He has been seen in the day room interacting with peers and staff appropriately. Patient is being discharged to [facility] and is being provided transportation. No prescriptions will be provided and medications are listed on AVS. Patient denies any SI/HI/AVH and contracts for safety.     Physical Findings: AIMS: Facial and Oral Movements Muscles of Facial Expression: None, normal Lips and Perioral Area: None, normal Jaw: None, normal Tongue: None, normal,Extremity Movements Upper (arms, wrists, hands, fingers): None, normal Lower (legs, knees, ankles, toes): None, normal, Trunk Movements Neck, shoulders, hips: None, normal, Overall Severity Severity of abnormal movements (highest score from questions above): None, normal Incapacitation due to abnormal movements: None, normal Patient's awareness of abnormal movements (rate only patient's report): No Awareness, Dental Status Current problems with teeth and/or dentures?: No Does patient usually wear dentures?: No  CIWA:  CIWA-Ar Total: 8 COWS:  COWS Total Score: 0  Musculoskeletal: Strength & Muscle Tone: within normal limits Gait & Station: normal Patient leans: N/A  Psychiatric Specialty Exam: Physical Exam  Nursing note and vitals reviewed. Constitutional: He is oriented to person, place, and time. He appears well-developed and well-nourished.  Cardiovascular: Normal rate.  Respiratory: Effort normal.  Musculoskeletal: Normal range of motion.  Neurological: He is alert and oriented to person, place, and time.  Skin: Skin is warm.    Review of Systems  Constitutional:  Negative.   HENT: Negative.   Eyes: Negative.   Respiratory: Negative.   Cardiovascular: Negative.   Gastrointestinal: Negative.   Genitourinary: Negative.   Musculoskeletal: Negative.   Skin: Negative.   Neurological: Negative.   Endo/Heme/Allergies: Negative.   Psychiatric/Behavioral: Negative.     Blood pressure 120/76, pulse 76, temperature 98.4 F (36.9 C), temperature source Oral, resp. rate 16, height 5\' 7"  (1.702 m), weight 64 kg (141 lb), SpO2 98 %.Body mass index is 22.08 kg/m.  General Appearance: Casual  Eye Contact:  Good  Speech:  Clear and Coherent  Volume:  Normal  Mood:  Anxious  Affect:  Congruent  Thought Process:  Goal Directed and Descriptions of Associations: Intact  Orientation:  Full (Time, Place, and Person)  Thought Content:  WDL  Suicidal Thoughts:  No  Homicidal Thoughts:  No  Memory:  Immediate;   Good Recent;   Good Remote;   Good  Judgement:  Good  Insight:  Good  Psychomotor Activity:  Normal  Concentration:  Concentration: Good and Attention Span: Good  Recall:  Good  Fund of Knowledge:  Good  Language:  Good  Akathisia:  No  Handed:  Right  AIMS (if indicated):     Assets:  Communication Skills Desire for Improvement Financial Resources/Insurance  Housing Social Support  ADL's:  Intact  Cognition:  WNL  Sleep:  Number of Hours: 5.5     Have you used any form of tobacco in the last 30 days? (Cigarettes, Smokeless Tobacco, Cigars, and/or Pipes): Yes  Has this patient used any form of tobacco in the last 30 days? (Cigarettes, Smokeless Tobacco, Cigars, and/or Pipes) Yes, Yes, A prescription for an FDA-approved tobacco cessation medication was offered at discharge and the patient refused  Blood Alcohol level:  Lab Results  Component Value Date   ETH <10 08/28/2017   ETH <5 03/21/2017    Metabolic Disorder Labs:  No results found for: HGBA1C, MPG No results found for: PROLACTIN No results found for: CHOL, TRIG, HDL, CHOLHDL,  VLDL, LDLCALC  See Psychiatric Specialty Exam and Suicide Risk Assessment completed by Attending Physician prior to discharge.  Discharge destination:  [facility]  Is patient on multiple antipsychotic therapies at discharge:  No   Has Patient had three or more failed trials of antipsychotic monotherapy by history:  No  Recommended Plan for Multiple Antipsychotic Therapies: NA   Allergies as of 09/01/2017      Reactions   Codeine Nausea Only      Medication List    STOP taking these medications   budesonide-formoterol 80-4.5 MCG/ACT inhaler Commonly known as:  SYMBICORT Replaced by:  mometasone-formoterol 100-5 MCG/ACT Aero   predniSONE 10 MG tablet Commonly known as:  DELTASONE     TAKE these medications     Indication  albuterol 108 (90 Base) MCG/ACT inhaler Commonly known as:  PROVENTIL HFA;VENTOLIN HFA Inhale 2 puffs every 6 (six) hours as needed into the lungs for wheezing or shortness of breath.  Indication:  Asthma   DULoxetine 30 MG capsule Commonly known as:  CYMBALTA Take 1 capsule (30 mg total) daily by mouth. For mood control Start taking on:  09/02/2017  Indication:  mood stability   gabapentin 100 MG capsule Commonly known as:  NEURONTIN Take 1 capsule (100 mg total) 3 (three) times daily by mouth. For withdrawal symptoms  Indication:  Alcohol Withdrawal Syndrome   hydrOXYzine 25 MG tablet Commonly known as:  ATARAX/VISTARIL Take 1 tablet (25 mg total) every 6 (six) hours as needed by mouth for anxiety.  Indication:  Feeling Anxious   mometasone-formoterol 100-5 MCG/ACT Aero Commonly known as:  DULERA Inhale 2 puffs 2 (two) times daily into the lungs. Replaces:  budesonide-formoterol 80-4.5 MCG/ACT inhaler  Indication:  Asthma   QUEtiapine 50 MG tablet Commonly known as:  SEROQUEL Take 3 tablets (150 mg total) at bedtime by mouth. For mood control  Indication:  mood stability        Follow-up recommendations:  Continue activity as tolerated.  Continue diet as recommended by your PCP. Ensure to keep all appointments with outpatient providers.  Comments:  Patient is instructed prior to discharge to: Take all medications as prescribed by his/her mental healthcare provider. Report any adverse effects and or reactions from the medicines to his/her outpatient provider promptly. Patient has been instructed & cautioned: To not engage in alcohol and or illegal drug use while on prescription medicines. In the event of worsening symptoms, patient is instructed to call the crisis hotline, 911 and or go to the nearest ED for appropriate evaluation and treatment of symptoms. To follow-up with his/her primary care provider for your other medical issues, concerns and or health care needs.    Signed: Gerlene Burdock Manahil Vanzile, FNP 09/01/2017, 1:11 PM

## 2017-09-02 MED ORDER — QUETIAPINE FUMARATE 50 MG PO TABS: 150.0000 mg | ORAL_TABLET | Freq: Every day | ORAL | 0 refills | Status: DC

## 2017-09-02 NOTE — [facility] Suicide Risk Assessment (Signed)
[facility]   Principal Problem: Substance induced mood disorder [facility](HCC) Discharge Diagnoses: SIMD Patient Active Problem List   Diagnosis Date Noted  . Suicidal ideations [R45.851] 08/29/2017  . 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 Substance induced mood disorder (HCC) [F19.94] 03/25/2017  . Polysubstance dependence including opioid type drug without complication, episodic abuse (HCC) [F19.20] 03/25/2017  . Aspiration pneumonia (HCC) [J69.0] 03/25/2017  . Delirium [R41.0]   . Ectopic atrial tachycardia (HCC) [I47.1]   . Acute hypercapnic respiratory failure (HCC) [J96.02] 03/22/2017  . COPD (chronic obstructive pulmonary disease) (HCC) [J44.9]   . Renal disorder [N28.9]     Total Time spent with patient: 30 minutes  Musculoskeletal: Strength & Muscle Tone: within normal limits Gait & Station: normal Patient leans: N/A  Psychiatric Specialty Exam: Review of Systems  Constitutional: Negative.   HENT: Negative.   Eyes: Negative.   Respiratory: Negative.   Cardiovascular: Negative.   Gastrointestinal: Negative.   Genitourinary: Negative.   Musculoskeletal: Negative.   Skin: Negative.   Neurological: Negative.   Endo/Heme/Allergies: Negative.   Psychiatric/Behavioral: Negative for depression, hallucinations, memory loss, substance abuse and suicidal ideas. The patient is not nervous/anxious and does not have insomnia.     Blood pressure 111/74, pulse (!) 59, temperature 98.7 F (37.1 C), resp. rate 18, height 5\' 7"  (1.702 m), weight 64 kg (141 lb), SpO2 98 %.Body mass index is 22.08 kg/m.  General Appearance: Neatly dressed, pleasant, engaging well and cooperative. Appropriate behavior. Not in any distress. Good relatedness. Not internally stimulated  Eye Contact::  Good  Speech:  Spontaneous, normal prosody. Normal tone and rate.   Volume:  Normal  Mood:  Euthymic  Affect:  Appropriate and Full Range  Thought Process:  Linear  Orientation:  Full (Time, Place, and Person)  Thought Content:  No delusional theme. No preoccupation with violent thoughts. No negative ruminations. No obsession.  No hallucination in any modality.   Suicidal Thoughts:  No  Homicidal Thoughts:  No  Memory:  Immediate;   Good Recent;   Good Remote;   Good  Judgement:  Good  Insight:  Good  Psychomotor Activity:  Normal  Concentration:  Good  Recall:  Good  Fund of Knowledge:Good  Language: Good  Akathisia:  Negative  Handed:    AIMS (if indicated):     Assets:  Communication Skills Desire for Improvement Resilience  Sleep:  Number of Hours: 5.5  Cognition: WNL  ADL's:  Intact   Clinical Assessment::   46 y.o Caucasian. Background history of SUD and mood disorder. Presented to the ER via the police. Expressed suicidal thoughts. Main stressor is being evicted from is mother's place. Use of substances has been affecting the quality of his relationship with his mother. Routine labs significant for mildly elevated liver enzymes.  UDS is positive for cocaine, opiates and benzodiazepines.  BAL is negative.  Seen today. Has completely come off psychoactive substances. No craving for substances. Feels ready for rehab. Reports that he is in good spirits. Not feeling depressed. Reports normal energy and interest. Has been maintaining normal biological functions. He is able to think clearly. He is able to focus on task. His thoughts are not crowded or racing. No evidence of mania. No hallucination in any modality. He is not making any delusional statement. No passivity of will/thought. He is fully in touch with reality. No  thoughts of suicide. No thoughts of homicide. No violent thoughts. No overwhelming anxiety.  Nursing staff reports that patient has been appropriate on the unit. Patient has  been interacting well with peers. No behavioral issues. Patient has not voiced any suicidal thoughts. Patient has not been observed to be internally stimulated. Patient has been adherent with treatment recommendations. Patient has been tolerating their medication well.   Patient was discussed at team. Team members feels that patient is back to his baseline level of function. Team agrees with plan to discharge patient today.    Demographic Factors:  Male, Caucasian and Low socioeconomic status  Loss Factors: Loss of significant relationship  Historical Factors: Impulsivity  Risk Reduction Factors:   Sense of responsibility to family, Positive social support, Positive therapeutic relationship and Positive coping skills or problem solving skills  Continued Clinical Symptoms:  As above  Cognitive Features That Contribute To Risk:  None    Suicide Risk:  Minimal: No identifiable suicidal ideation.  Patient is not having any thoughts of suicide at this time. Modifiable risk factors targeted during this admission includes depression and substance use. Demographical and historical risk factors cannot be modified. Patient is now engaging well. Patient is reliable and is future oriented. We have buffered patient's support structures. At this point, patient is at low risk of suicide. Patient is aware of the effects of psychoactive substances on decision making process. Patient has been provided with emergency contacts. Patient acknowledges to use resources provided if unforseen circumstances changes their current risk stratification.    Follow-up Information    [facility] Follow [facility].   Why:  You have been accepted to [facility] for Thursday 08/02/17 at 10:30AM. Admitting MD: Dr. Uvaldo RisingFaulkner. [employer] will transport you directly to the facility for your safety. Thank you.  Contact information: [address] [place] [id number] [phone]           Plan Of  Care/Follow-up recommendations:  1. Continue current psychotropic medications 2. Mental health and addiction follow up as arranged.  3. Provided limited quantity of prescriptions   Georgiann CockerVincent A Raeshaun Simson, MD 09/02/2017, 7:34 AM

## 2017-09-02 NOTE — Progress Notes (Signed)
Patient ID: Lawrence Hanna, male   DOB: 12/19/1970, 46 y.o.   MRN: [id number]  DAR: Pt. Denies SI/HI and A/V Hallucinations. Patient does not report any pain or discomfort at this time. Support and encouragement provided to the patient. Patient refused his morning medications, providers notified during treatment team. Patient is minimal but cooperative this morning. Patient was retrieved by sheriff this morning for transport to the next facility. Q15 minute safety checks maintained until time of discharge. No distress noted upon discharge.

## 2018-09-25 IMAGING — CT CT ANGIO CHEST
2 of 7 series · 19 of 46 positions shown · IV contrast (isovue)
Comparison: Chest radiographs dated 03/21/2017.

CLINICAL DATA: Shortness of breath, cough

EXAM:
CT ANGIOGRAPHY CHEST WITH CONTRAST
TECHNIQUE: Multidetector CT imaging of the chest was performed using the
standard protocol during bolus administration of intravenous
contrast. Multiplanar CT image reconstructions and MIPs were
obtained to evaluate the vascular anatomy.
CONTRAST:  100 mL Isovue 370 IV

[Series 6: thins for pacs · axial · 0.67mm/px · z∈[-303,-46]mm · 16 of 283 slices shown]
[im 13/283  lung]
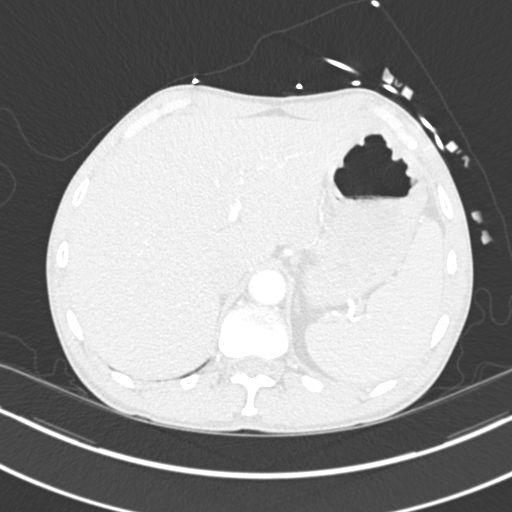
[im 37/283  soft-tissue]
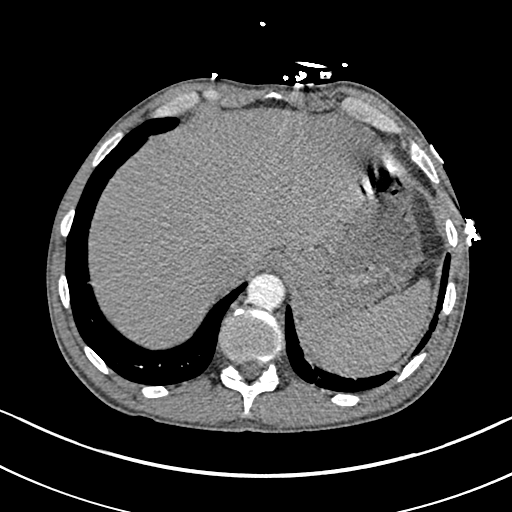
[im 50/283  lung]
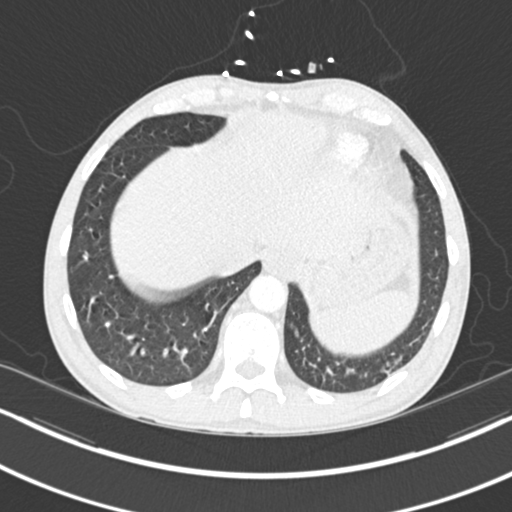
[im 62/283  soft-tissue]
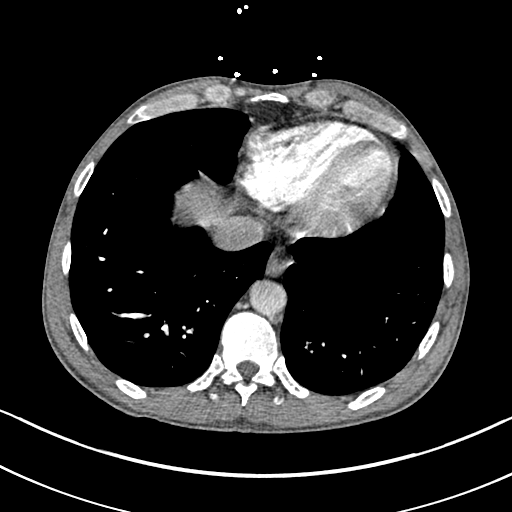
[im 86/283  lung]
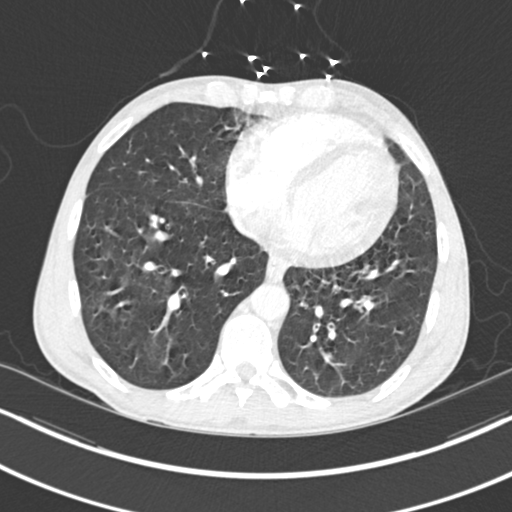
[im 99/283  soft-tissue]
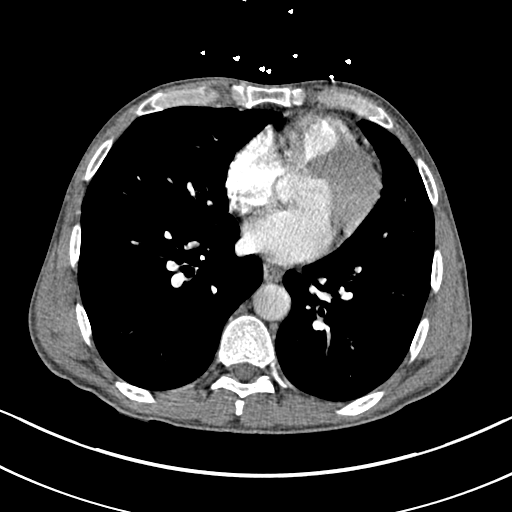
[im 111/283  lung]
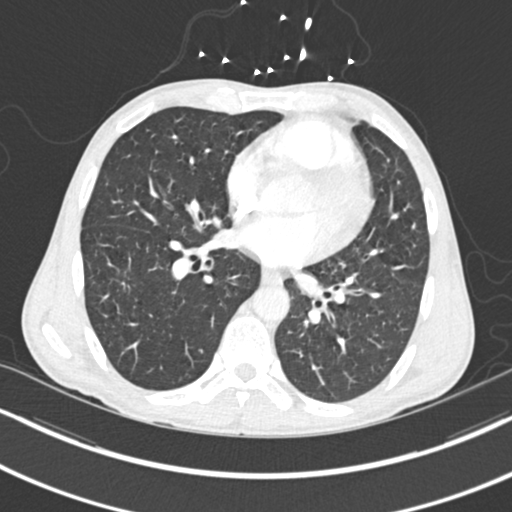
[im 135/283  soft-tissue]
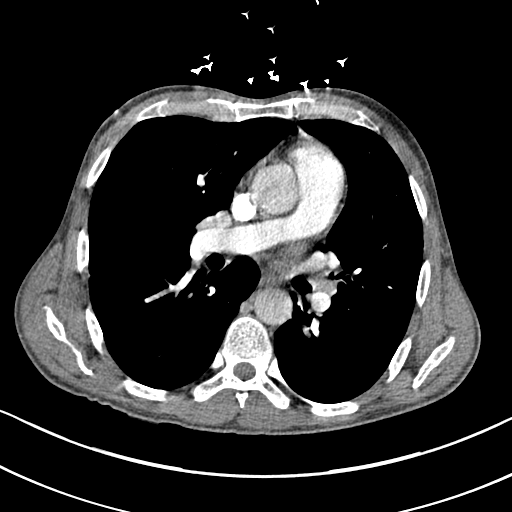
[im 148/283  lung]
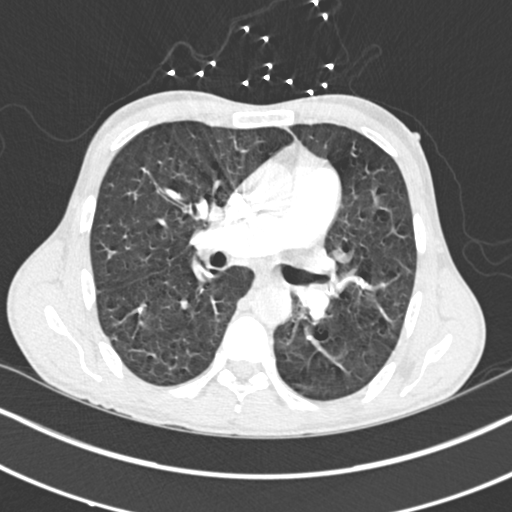
[im 172/283  soft-tissue]
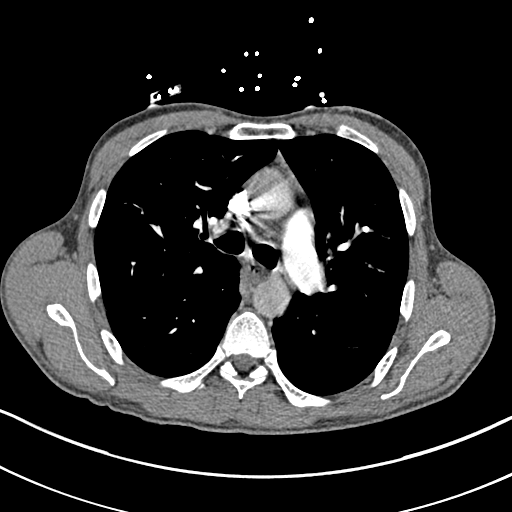
[im 184/283  lung]
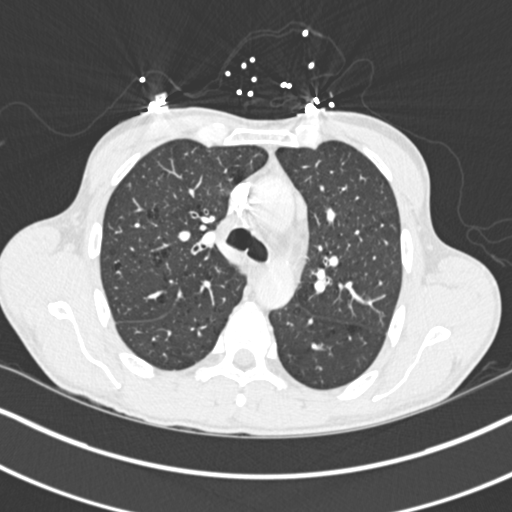
[im 197/283  soft-tissue]
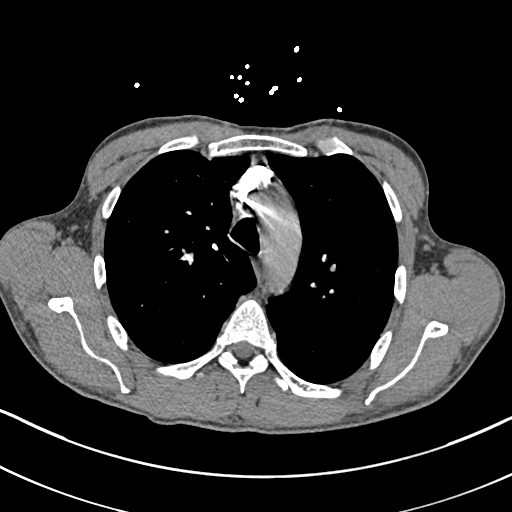
[im 221/283  lung]
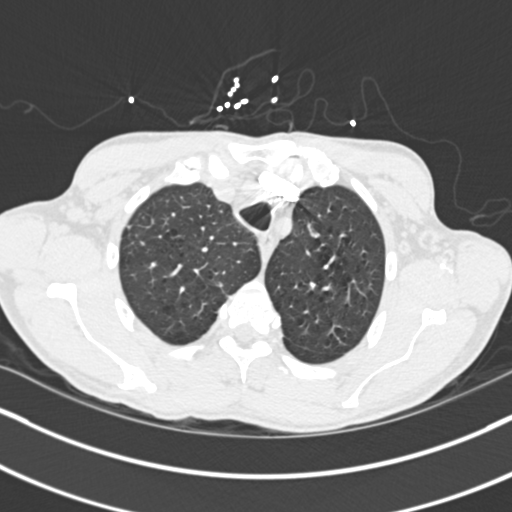
[im 233/283  soft-tissue]
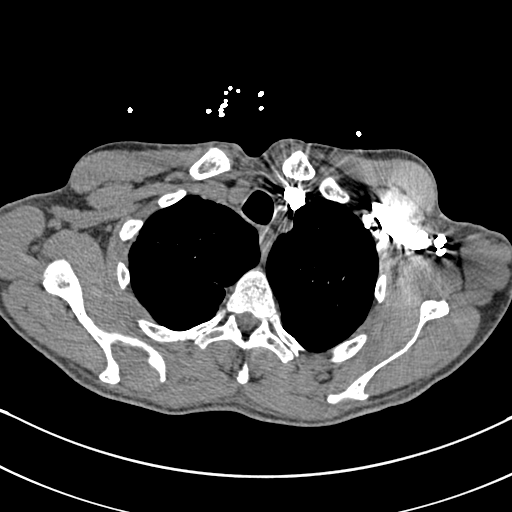
[im 246/283  lung]
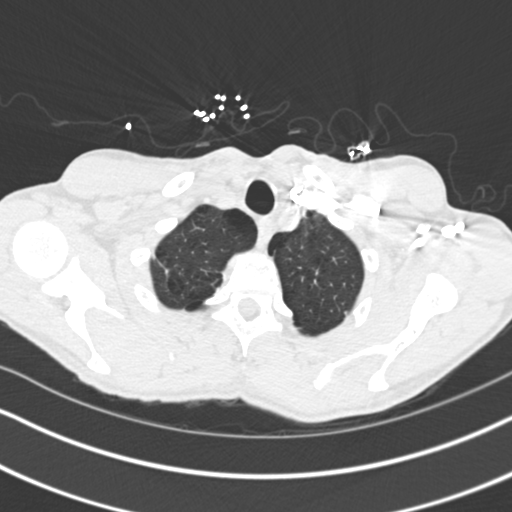
[im 270/283  soft-tissue]
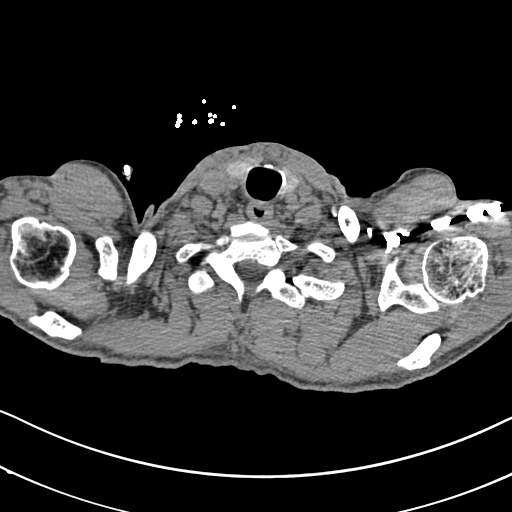

[Series 8: coronal mpr · coronal · 0.58mm/px · 3 of 115 slices shown]
[im 29/115  soft-tissue]
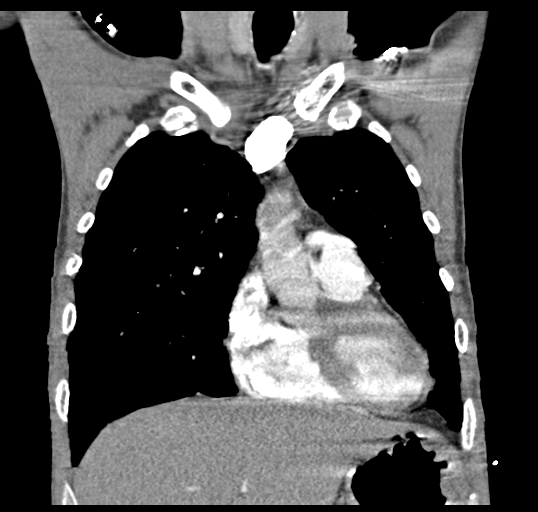
[im 58/115  soft-tissue]
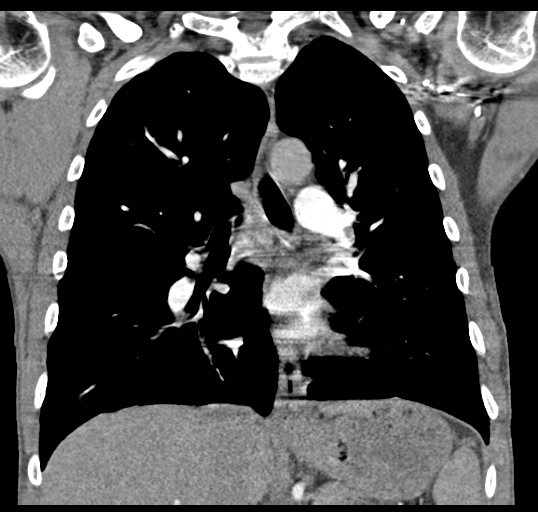
[im 86/115  soft-tissue]
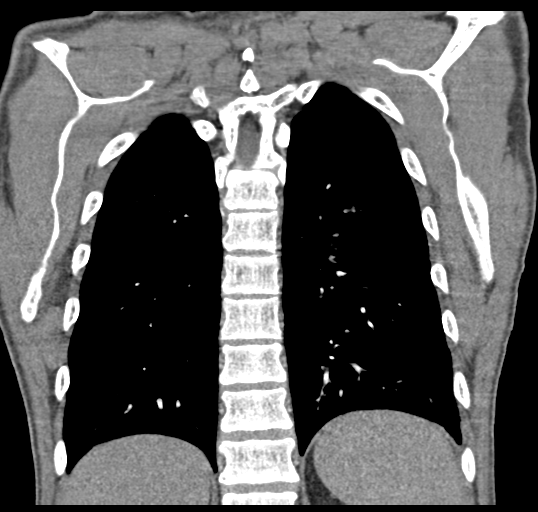

[19 of 46 positions shown; findings below may reference images not displayed]

FINDINGS: Cardiovascular: Satisfactory opacification the bilateral pulmonary
artery to the segmental level. Evaluation is mildly constrained due
to respiratory motion. No evidence pulmonary embolism.

The heart is normal in size.  No pericardial effusion.

No evidence of thoracic aortic aneurysm.

Mediastinum/Nodes: No suspicious mediastinal lymphadenopathy.

Visualized thyroid is unremarkable.

Lungs/Pleura: Evaluation of the lung parenchyma is constrained by
respiratory motion.

Mild centrilobular and paraseptal emphysematous changes, upper lobe
predominant.

Very mild patchy opacity in the medial right middle lobe (series 7/
image 66), likely infectious.

Minimal left basilar opacity, likely atelectasis.

No suspicious pulmonary nodules.

No pleural effusion or pneumothorax.

Upper Abdomen: Visualized upper abdomen is unremarkable.

Musculoskeletal: Visualized osseous structures are within normal
limits.

Review of the MIP images confirms the above findings.
IMPRESSION: No evidence pulmonary embolism.

Very mild patchy opacity in the medial right middle lobe, likely
reflecting mild infection.

## 2018-09-29 IMAGING — DX DG CHEST 1V PORT
1 series · 1 of 1 positions shown · non-contrast
Comparison: 03/24/2017.  03/21/2017.  CT 03/21/2017 .

CLINICAL DATA: Respiratory failure.

EXAM:
PORTABLE CHEST 1 VIEW

[chest ap]
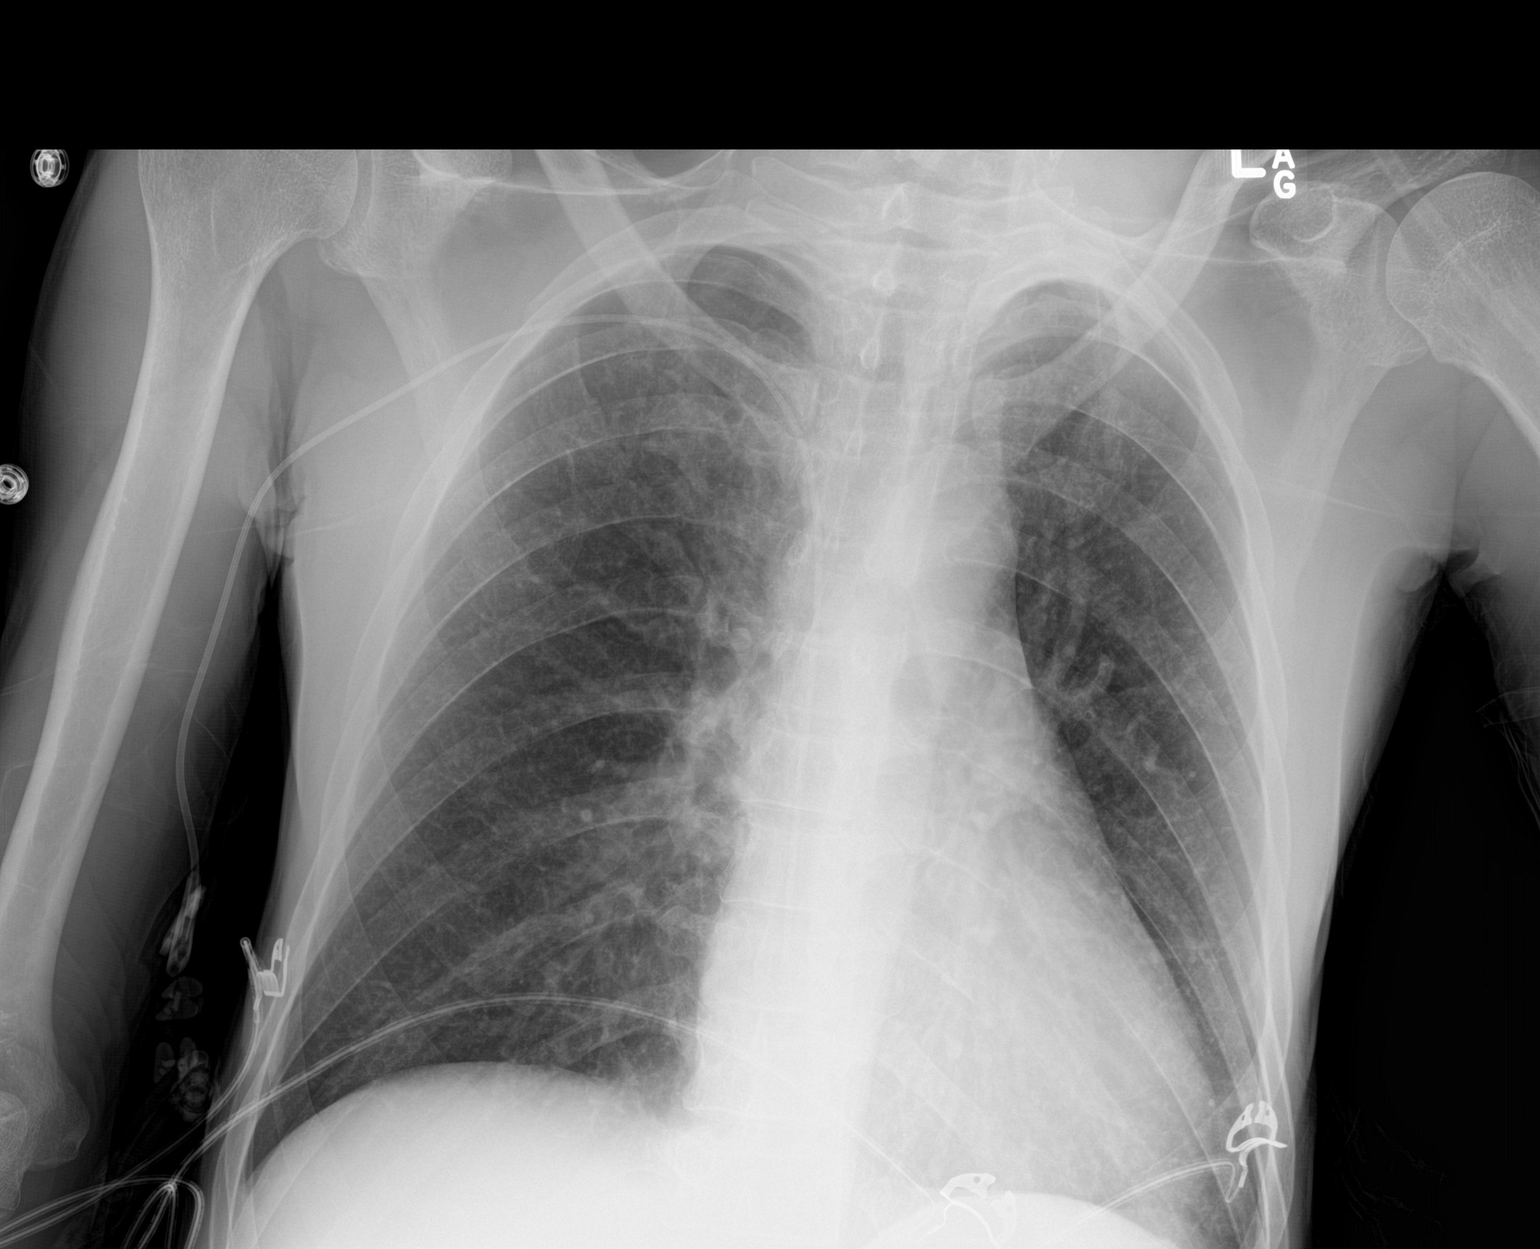

[1 of 1 positions shown; findings below may reference images not displayed]

FINDINGS: Right PICC line noted stable position. Cardiomegaly. No focal
infiltrate. No pleural effusion or pneumothorax. No acute bony
abnormality.
IMPRESSION: 1. Right PICC line in stable position.

2.  Stable cardiomegaly.  No focal infiltrate.

## 2021-12-25 NOTE — Progress Notes (Signed)
 [facility] Psychiatry - Psychiatric Consultation follow-up progress note   Date of Service: 12/25/2021 Referring Provider: Alverda LITTIE Chancy, MD Record Review: extensive Assessment   Lawrence Hanna is a 51 y.o. male with a past medical history of polysubstance use, alcohol dependence, COPD with chronic respiratory failure who presented under IVC commitment for suicidal and homicidal threats and is admitted with worsening pneumonia, COPD with exacerbation.  Psychiatry was consulted concerning medication management, suicidal/self-harm.  Impression: 1.  Opiate use disorder moderate dependence 2.  Alcohol use disorder severe dependence 3.  Anxiety and depression, probable mood disorder secondary to substance use  Problem List: Principal Problem:   Pneumonia due to infectious organism Active Problems:   Severe alcohol use disorder (*)   Transaminitis   Chronic obstructive pulmonary disease with acute exacerbation (*)   Opioid dependence (*)  12/25/2021 Patient seen, chart reviewed  Reviewed with RN Reviewed and seen along with Dr Chancy, Attending . Yesterday evening patient expressed desire to discharge from hospital .  Today presents awake, alert, attentive, oriented x 3.  He presents in bed, calm, polite and pleasant on approach, without restlessness or agitation. Reports mood is  pretty good  today and at this time exhibits a full range of affect . Denies hallucinations and does not present internally preoccupied, no delusions expressed, denies SI and presents future oriented, denies having any HI or violent ideations  States he is interested in exploring the possibility of going to an ALF . He states he realizes this may not be an option but would like to explore option. He states if this does not work,  I'll probably go stay with my brother .  He states he is already connected with pain clinic [facility] ) and that he recently was sent a  1 month refill for  Xanax ( which he reports he takes at 0.5 mgr TID) and for  Suboxone 8/2 mg( states is prescribed 3 daily but usually takes one per day) , so that he will not require refill for these medications prior to discharge . He denies medication side effec ts . Denies sedation and presents fully alert and attentive Vitals - Temp 98.4, Pulse 81, BP 111/80, SPO2 94  We have reviewed potential risk of excessive sedation /respiratory depression on combination of BZD and Opiate analgesics . Expresses understanding .  12/24/2021 Patient seen, chart reviewed. Sitter at bedside. Reviewed with RN. RN reports that patient has been calm/cooperative, without restlessness or agitation.  Currently Mr. [name] presents awake, alert, attentive, oriented x3. He presents calm /comfortable without psychomotor agitation or restlessness. He states he is feeling a lot better.  Currently presents with euthymic with a full range of affect.  At this time denies having any suicidal ideations and presents future oriented, focusing on discharge planning.  He denies any homicidal or violent ideations towards anyone.  He denies hallucinations and does not appear internally preoccupied.  He explains that prior to admission he was staying with girlfriend but that this is no longer an option/has broken up.  Reports he wants to go to an assisted living facility and also expresses interest in IOP referral if transportation could be secured from ALF to IOP setting. Vitals-temperature 98.9, BP 105/75, HR 88, SPO2 95. He denies medication side effects.  Meds reviewed, at this time on Vistaril  25 mg 3 times daily, BuSpar 10 mg 3 times daily, buprenorphine 8 mg sublingually daily.  Denies any sedation and presents fully  alert and attentive.  Oxycodone  PRN was discontinued yesterday.    12/22/2021 Patient seen, chart reviewed. Sitter at bedside. Presents in bed, awake, alert, generally attentive, is oriented x3. He reports some  vague malaise and body aches but reports he is feeling better.  At this time he is in bed, and does not appear to be in any acute distress or discomfort. Vitals are stable-temperature 97.8, BP 112/65, HR 89, SPO2 93 No tremors or diaphoresis noted. He reports having some passive thoughts of death in the context of chronic substance use disorder but denies suicidal plan or intention and presents future oriented, stating he wants to go to a program following discharge in order to continue substance use disorder treatment. Currently denies having any violent or homicidal ideations and specifically denies having any HI towards S0.  Reports he has no recollection of having made any violent or threatening statements. Meds reviewed (denies medication side effects at this time)-he is currently on Vistaril  25 mg every 8 hours for anxiety, Lexapro 10 mg daily for mood disorder/depression, BuSpar 10 mg 3 times daily for anxiety.  Denies medication side effects.  He has completed Ativan  taper.  He is on oxycodone  10 mg every 6 hours as needed for pain which he has been getting regularly (4 doses yesterday, 2 doses thus far today).  He has not required Haldol  PRN for agitation     12/21/2021 Patient was evaluated and chart was reviewed, patient continues to be on Valium taper today he is on 0.5 mg twice daily for 1 day then to be discontinued.  Upon evaluation this morning patient is sitting up in bed calm with no anxiety noted does not appear to be in any distress secondary to pain or anxiety, oxygen cannula in place patient presents pleasant and cooperative.  He continues to deny SI HI or AVH he continues to minimize his homicide threats toward his significant other and police officers prior to admission I do not know I do not remember maybe I did he does consistently admit to taking higher dose of prescribed Xanax on the day of admission today he tells me he took 25 when yesterday he said he took 8 or 9, he  denies suicidal intention to that overdose and continues to report I was not thinking straight he has been using oxycodone  around-the-clock here yet he tells me he does not have a prescriber after discharge and he plans to get off it, I discussed with him that I will start BuSpar 3 times daily for anxiety and he can use Vistaril  as needed for breakthrough anxiety, I discussed with him I am not recommending long-term treatment with benzodiazepine given high risk of tolerance and dependence as well as recent overdose/misuse of prescribed medication. I recommend patient to get people to gradually off oxycodone .  He is also on Subutex which she was on an outpatient basis but it is unclear who will be his prescriber after his discharge from here given reportedly he was fired from [facility] where he was getting scripts for oxycodone , Subutex and Xanax. Patient denies side effect to Lexapro started yesterday, continues to deny any current SI HI or AVH, remains under IVC.  Unfortunately I continue to be unable to reach significant other/petitioner of his IVC for collateral information and to address safety plan after discharge.  At this time patient is agreeing to follow-up at [facility] chest for his chronic breathing condition and also agrees to see outpatient psychiatric provider after discharge for  psychiatric medications. If he continues to deny SI HI or AVH in the next 1 to 2 days, it may be sufficient ground to reverse his IVC based on presentation.  12/20/2021 Patient was evaluated and chart was reviewed, patient was supposed to go to behavioral health unit yesterday but secondary to being on oxygen by nasal cannula transfer was not completed.  Patient uses oxygen at home at baseline.  Patient this morning patient is alert and fully oriented does not appear confused or delirious no withdrawals or tremors noted he continues to be on Ativan  taper for alcohol withdrawals.  He continues to be minimizing  drinking noting that he drank only 1 shot prior to the hospitalization which is not consistent with what his fiance reported.  Today he denies taking 44 tablets of Xanax as claimed by his fiance and notes I took 8 he tells me that he was not trying to harm himself but I was not thinking right to report he also overdosed on Subutex and made threats toward her outpatient provider for not prescribing medication he wants as well as made threats toward his fiance and police officers and threats to harm himself, he denies that and notes I did not do it she exaggerates referring to his fiance.  He does admit to depressed mood and anxiety and notes Remeron in the past did not help but agrees to start a trial of Lexapro today.  He denies any current SI HI or AVH.  Plan was discussed with primary treating team will start another referral to BHU to attempt transfer to [facility] unit where he can be accepted on oxygen via nasal cannula.  At the same time I will start him on Lexapro to address his depression and continue to monitor to see if we can stabilize him on antidepressants while here.  I will also start him on Vistaril  scheduled 3 times daily for management of anxiety to avoid using benzodiazepine long-term given her history of tolerance and dependence toward alcohol, pain medication and benzodiazepines.  I attempted to contact patient's fianc Mrs. Rivas for collateral information and to address safety of patient and others at home but no response, will reattempt later.  12/19/2021 Patient was evaluated and chart was reviewed indicating CIWA score between 0 and 5 in the past 24 hours he is compliant with Ativan  taper started yesterday.  Chart review indicates patient has been guarded and noncompliant with care provided.  Upon evaluation this morning patient is more cooperative than yesterday yet remains guarded he tells me he has mild tremors which was appreciated but he denies any other signs of withdrawals he  denies active SI intention or plan or HI or AVH but admits when confronted to taking large number of Xanax pills prior to admission but vague and guarded regard discussing intention or plan, he denies what was brought up paper that he threatened to harm to his fiance and it is questionable if he was intoxicated.  He continues to have poor insight to his medical and mental illness problems and need for help.  In my opinion patient continues to be of increased suicide risk if discharged home, continues to meet criteria for IVC.  Discussed with patient admission to psych unit after medically cleared and he notes he does not want to but I explained to him he is being under IVC as noted above.  The primary treating team patient is medically cleared and the process to refer to [facility] was started this morning, COVID test  was ordered.  Initial assessment on 12/18/2021 Patient was evaluated and chart was reviewed indicating patient presents for IVC petition by [place] reports.  Patient was admitted to this facility on February 12 and left AMA.  The 21st since discharge he has been drinking alcohol extensively per IVC papers he drinks every day for several years has multiple medical issues and refuses to comply with medical recommendation to stop drinking he is not receiving care at [facility] medical pain clinic due to him calling and threatening to kill the doctor for not writing him additional prescriptions, refuses to follow-up with PCP, this is indicated by IVC papers which also indicates that patient is often reporting suicidal and voicing hopelessness regarding his medical issues stating he wants to drink himself to death other than drinking and refusing care/self-neglect she is unaware of attempts to harm self, she denied he has access to a gun in the home is a shared report that he threatened to harm her siblings and his doctor stated he told her he took 9 Xanax pills in the morning prior to admission and later told  her that he took 44 pills at another time she is unable to verify that.  Patient was evaluated by this provider during last hospitalization note dated 2/12 was reviewed. Upon evaluation today patient is lying down in bed irritable and guarded he appears alert oriented to self place and time but guarded refusing to cooperate to discuss circumstances of his admission he does admit to drinking alcohol prior to coming here but refuses to discuss amount he does not present any withdrawals at this time but CIWA protocol was initiated and indicates high score at time of admission but improved gradually.  He denies any other drug use since discharge from the hospital and prior to this admission.  He denies thoughts of wanting to harm himself.  Refusing to explain what was noted in his IVC paperwork, he does admit to threatening to harm the police but they are refusing to cooperate any further.  He denies to me any thoughts to harm self or others at time of my evaluation.  He does not present responding to stimuli at this time.  I did explain to him the current process of IVC and also discussed with him that I will initiate Ativan  taper to address alcohol withdrawals.  We will start patient on Ativan  taper for alcohol withdrawals, will also start Haldol  as needed for agitation.  Recommend patient to continue under IVC and will continue to monitor during this hospitalization to assess disposition plan if will need psychiatric hospitalization after medically cleared.  Treatment Plan & Recommendations   - Disposition Recommendations:  - we will follow on consult service  Currently patient denying suicidal plan or intention, denying violent ideations or HI towards anyone.  Behavior  in good control.  Presents future oriented, focusing on discharge planning.  Based on above presentation, at this time there are no grounds for inpatient psychiatric admission or for ongoing involuntary commitment .     -  Commitment Status: As per above, would consider discontinuing one-to-one sitter. - Precautions:  - DT/ETOH   - Pertinent Lab findings: 3/1 EKG NSR, QTc 467. 3/1 sodium 136, potassium 4.1, BUN 27, creatinine 1.0, WBC 6.9, hemoglobin 14.6, platelet count 388 2/22 AST 41, ALT 64 2/22 BAL 139, UDS positive for benzodiazepines 2/22 head CT-no acute intracranial abnormalities 2/22 EKG NSR, QTc 508  ( trended up from 495 on 2/12) - reviewed EKG with cardiology consultant  Recommendations: - Medications  Continue  BuSpar 10 mg TID for chronic anxiety Continue Subutex 8 mg SL QDAY for opiate use disorder  Continue Vistaril  25 mg TID PRN  for anxiety  - Labs/Studies Defer to primary treating team - Behavioral/Other Supportive Currently patient presents future oriented and focused on discharge planning/disposition options.  Expresses interest in IOP.  Interested in being referred to a residential type setting at discharge/requesting ALF referral   All diagnostic procedures completed since admission were reviewed.  Past medical records reviewed.  Coordination of care was discussed with primary team.  Patient was informed and states understanding of potential risks and benefits of proposed treatment plan.   History of Present Illness   Noa Constante is a 51 y.o. male with a psychiatric history of anxiety, depression, polysubstance use including alcohol opiates and benzodiazepine admitted for pneumonia, COPD exacerbation  Collateral Info: None noted  Chart review: Hospitalization March 2022, previous consults most recently 12/07/2021, notes were reviewed  [place]:Monthly scripts of Xanax 0.5 mg last filled January 2023, monthly prescription Suboxone 8/2 mg, monthly prescription oxycodone  10 mg  Current suicidal/homicidal ideations: Denies Current auditory/visual hallucinations: Denies  Review Of Systems: A 10 point review of systems of the following systems was conducted  (Constitutional, Psychiatric, Neurological, Musculoskeletal, Eyes, Gastrointestinal, Cardiovascular, Respiratory, Skin, and Endocrine). All reviewed systems are negative except pertinent positives listed as follows: None.  Past Psychiatric History   Previous suicide attempts: Denies History of self injures behavior: Denies  Previous psychiatric admissions: Admission to [facility] March 2022  Previous inpatient/outpatient rehab: Denies  Previous diagnoses: Anxiety, depression, alcohol dependence and opiate dependence  Previous psychiatric medication trials: Remeron, Seroquel , Suboxone, Xanax  Current/Past psychiatric provider: Used to see a provider at [facility]  Past Medical History   Past Medical History:  Diagnosis Date   COPD (chronic obstructive pulmonary disease) (*)    Encounter for acute hemodialysis (*)    Lung cancer (*)     Substance Use History   Tobacco: Half to 1 pack daily History of using cocaine on and off but none recently History of misusing opiates in the past but denies any recent misuse or abuse Denies misuse or abuse of currently prescribed benzodiazepine History of drinking 1 pint of liquor daily but notes recently has been drinking only 2-3 times weekly with last drink 5 days prior to admission, history of withdrawals but denies any history of withdrawal seizures or DTs Denies history of DWIs or rehab treatment Denies other substances use Refuses referral for outpatient substance use treatment.  Family History  Family history of suicide: None noted Family history of mental illness: None noted Family history of substance use disorder: None noted  Family History  Problem Relation Age of Onset   Kidney disease Mother    Cancer Father        lung    Social History   Living Situation: Lives in [place] with his girlfriend of 5 years Marital status: Single  children: No children Social Support: Limited Education:  Unknown  work: Scientist, Research (life Sciences) to firearms: None noted Trauma Hx: None noted Legal: None noted Financial Planner: None noted  Social History   Socioeconomic History   Marital status: Single    Spouse name: Not on file   Number of children: Not on file   Years of education: Not on file   Highest education level: Not on file  Occupational History   Not on file  Tobacco Use   Smoking status:  Every Day    Packs/day: 2.00    Years: 36.00    Pack years: 72.00    Types: Cigarettes    Start date: 1986   Smokeless tobacco: Never  Vaping Use   Vaping Use: Never used  Substance and Sexual Activity   Alcohol use: Yes    Alcohol/week: 3.0 standard drinks    Types: 3 Cans of beer per week    Comment: 02/18/19:3-4 beers daily   Drug use: Never   Sexual activity: Not on file  Other Topics Concern   Not on file  Social History Narrative   Not on file   Social Determinants of Health   Financial Resource Strain: Low Risk    Difficulty of Paying Living Expenses: Not very hard  Food Insecurity: Not on file  Transportation Needs: Unmet Transportation Needs   Lack of Transportation (Medical): Yes   Lack of Transportation (Non-Medical): Yes  Physical Activity: Not on file  Stress: Stress Concern Present   Feeling of Stress : Very much  Social Connections: Not on file  Intimate Partner Violence: Not on file  Housing Stability: Not on file    Evaluation   Vitals:  Vitals:   12/25/21 1511  BP:   Pulse: 81  Resp: 18  Temp: 98.4 F (36.9 C)  SpO2: 94%    Medications:  arformoterol  15 mcg Nebulization BID RSP   budesonide   0.5 mg Nebulization BID   buprenorphine  8 mg Sublingual Daily   busPIRone  10 mg Oral TID   furosemide  40 mg Oral Daily   hydrOXYzine  pamoate  25 mg Oral TID   ipratropium-albuterol   3 mL Nebulization Q6H SCH RSP   mupirocin   Intranasal BID   pantoprazole sodium  40 mg Oral BID   THERA  1 tablet Oral Daily   thiamine    100 mg Oral Daily   acetaminophen  **OR** acetaminophen , acetaminophen  **OR** acetaminophen , ALPRAZolam, ondansetron  **OR** ondansetron , oxyCODONE  HCl, polyethylene glycol, simethicone Allergies: Allergies  Allergen Reactions   Codeine Hives and Nausea Only   Seroquel  [Quetiapine ] Other    Sleep walk    Labs: Recent Results (from the past 72 hour(s))  Basic Metabolic Panel   Collection Time: 12/24/21  7:49 AM  Result Value Ref Range   Na 136 136 - 146 mmol/L   Potassium 4.1 3.7 - 5.4 mmol/L   Cl 95 (L) 97 - 108 mmol/L   CO2 31 20 - 32 mmol/L   AGAP 10 7 - 16 mmol/L   Glucose 107 (H) 65 - 99 mg/dL   BUN 27 (H) 6 - 24 mg/dL   Creatinine 8.99 9.23 - 1.27 mg/dL   Ca 9.9 8.7 - 89.7 mg/dL   BUN/CREAT RATIO 72.9 (H) 11.0 - 26.0   eGFR 91 mL/min/1.22m2  CBC   Collection Time: 12/24/21  7:49 AM  Result Value Ref Range   WBC 6.9 5.1 - 10.8 thou/mcL   RBC 4.62 4.05 - 5.64 million/mcL   HGB 14.6 13.5 - 17.5 gm/dL   HCT 55.2 59.4 - 47.4 %   MCV 97 83 - 97 fL   MCH 31.6 28.0 - 33.0 pg   MCHC 32.7 32.0 - 36.0 gm/dL   Plt Ct 611 849 - 599 thou/mcL   RDW SD 52.2 (H) 36.0 - 47.0 fL   MPV 10.0 8.9 - 11.0 fL   NRBC% 0.0 0 /100WBC   NRBC 0.000 0 thou/mcL    Imaging Results: No results found.  Results for orders  placed or performed during the hospital encounter of 12/17/21  ECG 12 lead   Narrative   Diagnosis Class Abnormal Acquisition Device D3K Systolic BP 119 Diastolic BP 74 Ventricular Rate 97 Atrial Rate 97 P-R Interval 142 QRS Duration 98 Q-T Interval 400 QTC Calculation(Bazett) 508 Calculated P Axis 80 Calculated R Axis 116 Calculated T Axis 68  Diagnosis Normal sinus rhythm Incomplete right bundle branch block Left posterior fascicular block Prolonged QT Abnormal ECG When compared with ECG of 17-Dec-2021 20:02, No significant change was found Means, Gregory ([id number]) on 12/18/2021 6:54:21 AM certifies that he/she has reviewed the ECG tracing and confirms the  independent  interpretation is correct.  ECG 12 lead   Narrative   Diagnosis Class Abnormal Acquisition Device D3K Ventricular Rate 78 Atrial Rate 78 P-R Interval 152 QRS Duration 96 Q-T Interval 410 QTC Calculation(Bazett) 467 Calculated P Axis 78 Calculated R Axis -114 Calculated T Axis 82  Diagnosis Normal sinus rhythm Tall T waves When compared with ECG of 17-Dec-2021 20:03, Left posterior fascicular block is no longer present Incomplete right bundle branch block is no longer present T wave amplitude has increased in Inferior leads Zhao, Jin ([id number]) on 12/24/2021 8:21:18 AM certifies that he/she has reviewed the ECG tracing and confirms the independent  interpretation is correct.    Anticonvulsant Drug Levels: No results found for this or any previous visit (from the past 87903 hour(s)).  Mental Status Evaluation   Constitutional:   General Appearance  presents awake, alert, attentive, comfortable in bed, makes good eye contact on approach, does not appear to be in any acute distress or discomfort.   General Behavior  pleasant and cooperative on approach   Musculoskeletal:   Gait and Station not formally tested  Strength and tone Not formally tested  Psychiatric:   Psychomotor Activity  no restlessness or agitation noted    Speech   fluent  Mood   denies depression and presents euthymic  Affect   Full in range, reactive  Thought Process Linear    Associations Intact   Thought Content/Perceptual Disturbances  presents future oriented.  Denies SI.  Denies HI or violent ideations towards anyone.  No hallucinations noted or endorsed.  Does not appear internally preoccupied.  No delusions are expressed.  Cognition/Sensorium   awake, alert, attentive, oriented x3  Insight   fair/improving   Judgment  fair/improving     Other findings: N/A  Psychiatric / Cognitive Instruments: N/A.  Please be aware that I may have you use dragon software for part of this  note.  Electronically signed by: Erla DELENA Kubas, MD 12/25/2021 3:18 PM

## 2024-10-16 DIAGNOSIS — R9431 Abnormal electrocardiogram [ECG] [EKG]: Secondary | ICD-10-CM | POA: Diagnosis not present

## 2024-10-16 DIAGNOSIS — R000 Tachycardia, unspecified: Secondary | ICD-10-CM | POA: Diagnosis not present

## 2024-10-16 DIAGNOSIS — I444 Left anterior fascicular block: Secondary | ICD-10-CM | POA: Diagnosis not present

## 2024-11-13 ENCOUNTER — Emergency Department (HOSPITAL_COMMUNITY): Payer: MEDICAID

## 2024-11-13 ENCOUNTER — Other Ambulatory Visit: Payer: Self-pay

## 2024-11-13 ENCOUNTER — Encounter (HOSPITAL_COMMUNITY): Payer: Self-pay | Admitting: Emergency Medicine

## 2024-11-13 ENCOUNTER — Emergency Department (HOSPITAL_COMMUNITY)
Admission: EM | Admit: 2024-11-13 | Discharge: 2024-11-14 | Disposition: A | Discharge status: Other Acute Inpatient Hospital | Payer: MEDICAID | Attending: Emergency Medicine | Admitting: Emergency Medicine

## 2024-11-13 DIAGNOSIS — F332 Major depressive disorder, recurrent severe without psychotic features: Secondary | ICD-10-CM

## 2024-11-13 DIAGNOSIS — Y906 Blood alcohol level of 120-199 mg/100 ml: Secondary | ICD-10-CM | POA: Diagnosis not present

## 2024-11-13 DIAGNOSIS — Z9981 Dependence on supplemental oxygen: Secondary | ICD-10-CM | POA: Insufficient documentation

## 2024-11-13 DIAGNOSIS — J449 Chronic obstructive pulmonary disease, unspecified: Secondary | ICD-10-CM | POA: Insufficient documentation

## 2024-11-13 DIAGNOSIS — R45851 Suicidal ideations: Secondary | ICD-10-CM

## 2024-11-13 DIAGNOSIS — R0602 Shortness of breath: Secondary | ICD-10-CM | POA: Diagnosis present

## 2024-11-13 DIAGNOSIS — R7401 Elevation of levels of liver transaminase levels: Secondary | ICD-10-CM | POA: Diagnosis not present

## 2024-11-13 DIAGNOSIS — F329 Major depressive disorder, single episode, unspecified: Secondary | ICD-10-CM | POA: Insufficient documentation

## 2024-11-13 DIAGNOSIS — F17200 Nicotine dependence, unspecified, uncomplicated: Secondary | ICD-10-CM | POA: Insufficient documentation

## 2024-11-13 DIAGNOSIS — Z008 Encounter for other general examination: Secondary | ICD-10-CM

## 2024-11-13 LAB — COMPREHENSIVE METABOLIC PANEL WITH GFR
ALT: 103 U/L — ABNORMAL HIGH (ref 0–44)
AST: 76 U/L — ABNORMAL HIGH (ref 15–41)
Albumin: 3.7 g/dL (ref 3.5–5.0)
Alkaline Phosphatase: 96 U/L (ref 38–126)
Anion gap: 12 (ref 5–15)
BUN: 12 mg/dL (ref 6–20)
CO2: 25 mmol/L (ref 22–32)
Calcium: 9 mg/dL (ref 8.9–10.3)
Chloride: 102 mmol/L (ref 98–111)
Creatinine, Ser: 0.83 mg/dL (ref 0.61–1.24)
GFR, Estimated: >60 mL/min
Glucose, Bld: 78 mg/dL (ref 70–99)
Potassium: 4.3 mmol/L (ref 3.5–5.1)
Sodium: 139 mmol/L (ref 135–145)
Total Bilirubin: <0.2 mg/dL (ref 0.0–1.2)
Total Protein: 6.4 g/dL — ABNORMAL LOW (ref 6.5–8.1)

## 2024-11-13 LAB — URINE DRUG SCREEN
Amphetamines: NEGATIVE
Barbiturates: NEGATIVE
Benzodiazepines: POSITIVE — AB
Cocaine: NEGATIVE
Fentanyl: NEGATIVE
Methadone Scn, Ur: NEGATIVE
Opiates: NEGATIVE
Tetrahydrocannabinol: POSITIVE — AB

## 2024-11-13 LAB — CBC WITH DIFFERENTIAL/PLATELET
Abs Immature Granulocytes: 0.03 K/uL (ref 0.00–0.07)
Basophils Absolute: 0 K/uL (ref 0.0–0.1)
Basophils Relative: 0 %
Eosinophils Absolute: 0.2 K/uL (ref 0.0–0.5)
Eosinophils Relative: 2 %
HCT: 41.3 % (ref 39.0–52.0)
Hemoglobin: 13 g/dL (ref 13.0–17.0)
Immature Granulocytes: 0 %
Lymphocytes Relative: 17 %
Lymphs Abs: 1.2 K/uL (ref 0.7–4.0)
MCH: 31.9 pg (ref 26.0–34.0)
MCHC: 31.5 g/dL (ref 30.0–36.0)
MCV: 101.5 fL — ABNORMAL HIGH (ref 80.0–100.0)
Monocytes Absolute: 0.5 K/uL (ref 0.1–1.0)
Monocytes Relative: 7 %
Neutro Abs: 5.1 K/uL (ref 1.7–7.7)
Neutrophils Relative %: 74 %
Platelets: 365 K/uL (ref 150–400)
RBC: 4.07 MIL/uL — ABNORMAL LOW (ref 4.22–5.81)
RDW: 15 % (ref 11.5–15.5)
WBC: 7 K/uL (ref 4.0–10.5)
nRBC: 0 % (ref 0.0–0.2)

## 2024-11-13 LAB — MAGNESIUM: Magnesium: 2 mg/dL (ref 1.7–2.4)

## 2024-11-13 LAB — ETHANOL: Alcohol, Ethyl (B): 138 mg/dL — ABNORMAL HIGH

## 2024-11-13 LAB — SALICYLATE LEVEL: Salicylate Lvl: <7 mg/dL — ABNORMAL LOW (ref 7.0–30.0)

## 2024-11-13 LAB — ACETAMINOPHEN LEVEL: Acetaminophen (Tylenol), Serum: <10 ug/mL — ABNORMAL LOW (ref 10–30)

## 2024-11-13 MED ORDER — IPRATROPIUM-ALBUTEROL 0.5-2.5 (3) MG/3ML IN SOLN
3.0000 mL | Freq: Once | RESPIRATORY_TRACT | Status: AC
  Administered 2024-11-13: 3 mL via RESPIRATORY_TRACT
  Filled 2024-11-13: qty 3

## 2024-11-13 MED ORDER — LORAZEPAM 2 MG/ML IJ SOLN: 0.0000 mg | Freq: Four times a day (QID) | INTRAMUSCULAR | Status: DC

## 2024-11-13 MED ORDER — THIAMINE HCL 100 MG/ML IJ SOLN: 100.0000 mg | Freq: Every day | INTRAMUSCULAR | Status: DC

## 2024-11-13 MED ORDER — ACETAMINOPHEN 325 MG PO TABS
650.0000 mg | ORAL_TABLET | Freq: Once | ORAL | Status: AC
  Administered 2024-11-13: 650 mg via ORAL
  Filled 2024-11-13: qty 2

## 2024-11-13 MED ORDER — LORAZEPAM 2 MG/ML IJ SOLN: 0.0000 mg | Freq: Two times a day (BID) | INTRAMUSCULAR | Status: DC

## 2024-11-13 MED ORDER — LORAZEPAM 1 MG PO TABS: 0.0000 mg | ORAL_TABLET | Freq: Two times a day (BID) | ORAL | Status: DC

## 2024-11-13 MED ORDER — IPRATROPIUM-ALBUTEROL 0.5-2.5 (3) MG/3ML IN SOLN
3.0000 mL | RESPIRATORY_TRACT | Status: AC
  Administered 2024-11-13: 3 mL via RESPIRATORY_TRACT
  Filled 2024-11-13: qty 3

## 2024-11-13 MED ORDER — THIAMINE MONONITRATE 100 MG PO TABS
100.0000 mg | ORAL_TABLET | Freq: Every day | ORAL | Status: DC
  Administered 2024-11-13 – 2024-11-14 (×2): 100 mg via ORAL
  Filled 2024-11-13 (×2): qty 1

## 2024-11-13 MED ORDER — LORAZEPAM 1 MG PO TABS
0.0000 mg | ORAL_TABLET | Freq: Four times a day (QID) | ORAL | Status: DC
  Administered 2024-11-13: 1 mg via ORAL
  Administered 2024-11-13 – 2024-11-14 (×3): 2 mg via ORAL
  Filled 2024-11-13 (×2): qty 2
  Filled 2024-11-13: qty 1
  Filled 2024-11-13: qty 2

## 2024-11-13 NOTE — ED Provider Notes (Signed)
 Patient requesting DuoNeb treatment that he usually takes at the house.  No respiratory distress at this time.  DuoNebs ordered.   Elnor Hila P, DO 11/13/24 [id number]

## 2024-11-13 NOTE — ED Triage Notes (Addendum)
 Pt arrived [facility] from home. [facility] went [facility] [place] to try to get some help for SI. They refused to keep him because he requires 3L O2 at baseline. Pt called EMS from there to bring him here for treatment of his SI. Pt states that this is the most depressed he has ever been. When I asked pt if he had a plan he said No, I don't need to plan I know how I would do it, I would just go get a bunch a fentanyl  and shoot it up. Pt has a hx of COPD and MI.

## 2024-11-13 NOTE — ED Notes (Signed)
 Pt said that he is not homeless and smokes a pack of cigarettes a day and does not do any drugs or drink.
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
## 2024-11-13 NOTE — ED Notes (Signed)
 Patient personal belongings

## 2024-11-13 NOTE — ED Notes (Signed)
Pt has fallen asleep.

## 2024-11-13 NOTE — Progress Notes (Signed)
 Inpatient Psychiatric Referral  Patient was recommended inpatient per Elveria Batter, NP. There are no available beds at [facility], per [facility]. Patient was referred to the following out of network facilities:  [facility] Provider Address Phone Fax  [facility]  [address], [place] [address] [phone]  [facility]  [address] [place] [address] [phone]  [facility]  [address] [place], [place] [address] [phone]  [facility]  [address] [place] [address] [phone]  [facility]  [address], [place] [address] [phone]  [facility]  [address], [place] [address] [phone]  [facility] EFAX  [address] [place], [place] [phone]  [facility]  [address], [place] [address] [phone]  [facility]  [address] [place] [address] [phone]  [facility]  [address] [place] [address] [phone]    Situation ongoing, CSW to continue following and update chart as more information becomes available.   Harrie Sofia MSW, [place] 11/13/2024

## 2024-11-13 NOTE — ED Provider Notes (Addendum)
 " [facility] Provider Note   CSN: [id number] Arrival date & time: 11/13/24  9155     Patient presents with: SI   Lawrence Hanna is a 54 y.o. male.   54 year old male presents today for concern of suicidal ideation.  He states he plans to overdose on fentanyl .  Has history of COPD.  No chest pain but endorses some shortness of breath.  He is on chronic supplemental oxygen on 3 to liters.  Currently on 3 L as well.  No wheezing.  Denies homicidal ideation, or AVH.  The history is provided by the patient. No language interpreter was used.       Prior to Admission medications  Medication Sig Start Date End Date Taking? Authorizing Provider  albuterol  (PROVENTIL  HFA;VENTOLIN  HFA) 108 (90 Base) MCG/ACT inhaler Inhale 2 puffs every 6 (six) hours as needed into the lungs for wheezing or shortness of breath. 09/01/17   Money, Caron NOVAK, FNP  DULoxetine  (CYMBALTA ) 30 MG capsule Take 1 capsule (30 mg total) daily by mouth. For mood control 09/02/17   Money, Caron NOVAK, FNP  gabapentin  (NEURONTIN ) 100 MG capsule Take 1 capsule (100 mg total) 3 (three) times daily by mouth. For withdrawal symptoms 09/01/17   Money, Caron NOVAK, FNP  hydrOXYzine  (ATARAX /VISTARIL ) 25 MG tablet Take 1 tablet (25 mg total) every 6 (six) hours as needed by mouth for anxiety. 09/01/17   Money, Caron NOVAK, FNP  mometasone -formoterol  (DULERA ) 100-5 MCG/ACT AERO Inhale 2 puffs 2 (two) times daily into the lungs. 09/01/17   Money, Caron NOVAK, FNP  QUEtiapine  (SEROQUEL ) 50 MG tablet Take 3 tablets (150 mg total) at bedtime by mouth. For mood control 09/02/17   Money, Caron NOVAK, FNP    Allergies: Codeine and Seroquel  [quetiapine ]    Review of Systems  Constitutional:  Negative for fever.  Respiratory:  Positive for shortness of breath. Negative for cough.   Cardiovascular:  Negative for chest pain.  Neurological:  Negative for light-headedness.  Psychiatric/Behavioral:  Positive for suicidal  ideas. Negative for self-injury and sleep disturbance.   All other systems reviewed and are negative.   Updated Vital Signs BP (!) 130/97 (BP Location: Left Arm)   Pulse 99   Temp 98.1 F (36.7 C) (Oral)   Resp 18   SpO2 98%   Physical Exam Vitals and nursing note reviewed.  Constitutional:      General: He is not in acute distress.    Appearance: Normal appearance. He is not ill-appearing.  HENT:     Head: Normocephalic and atraumatic.     Nose: Nose normal.  Eyes:     Conjunctiva/sclera: Conjunctivae normal.  Cardiovascular:     Rate and Rhythm: Normal rate and regular rhythm.  Pulmonary:     Effort: Pulmonary effort is normal. No respiratory distress.  Abdominal:     Palpations: Abdomen is soft.  Musculoskeletal:        General: No deformity. Normal range of motion.     Cervical back: Normal range of motion.  Skin:    Findings: No rash.  Neurological:     General: No focal deficit present.     Mental Status: He is alert and oriented to person, place, and time. Mental status is at baseline.     Cranial Nerves: No cranial nerve deficit.     Motor: No weakness.  Psychiatric:        Mood and Affect: Mood is depressed.  Thought Content: Thought content is not paranoid or delusional. Thought content includes suicidal ideation. Thought content does not include homicidal ideation. Thought content includes suicidal plan. Thought content does not include homicidal plan.     (all labs ordered are listed, but only abnormal results are displayed) Labs Reviewed  CBC WITH DIFFERENTIAL/PLATELET - Abnormal; Notable for the following components:      Result Value   RBC 4.07 (*)    MCV 101.5 (*)    All other components within normal limits  COMPREHENSIVE METABOLIC PANEL WITH GFR - Abnormal; Notable for the following components:   Total Protein 6.4 (*)    AST 76 (*)    ALT 103 (*)    All other components within normal limits  ACETAMINOPHEN  LEVEL - Abnormal; Notable for  the following components:   Acetaminophen  (Tylenol ), Serum <10 (*)    All other components within normal limits  ETHANOL - Abnormal; Notable for the following components:   Alcohol, Ethyl (B) 138 (*)    All other components within normal limits  SALICYLATE LEVEL - Abnormal; Notable for the following components:   Salicylate Lvl <7.0 (*)    All other components within normal limits  MAGNESIUM   CBC WITH DIFFERENTIAL/PLATELET  URINE DRUG SCREEN    EKG: None  Radiology: DG Chest 2 View Result Date: 11/13/2024 CLINICAL DATA:  Dyspnea. History of chronic obstructive pulmonary disease. EXAM: CHEST - 2 VIEW COMPARISON:  Radiographs 10/30/2024.  CT 10/15/2024. FINDINGS: The heart size and mediastinal contours are stable. There is mild aortic atherosclerosis. Stable mild pulmonary hyperinflation and interstitial prominence. No edema, confluent airspace disease, pneumothorax or significant pleural effusion. The bones appear unchanged. IMPRESSION: Stable chest. No acute cardiopulmonary process. Electronically Signed   By: Elsie Perone M.D.   On: 11/13/2024 10:06     Procedures   Medications Ordered in the ED - No data to display  Clinical Course as of 11/13/24 1443  Mon Nov 13, 2024  1324 Evaluated by TTS.  They recommend inpatient psychiatric admission [AA]  1443 CIWA protocol ordered given his elevated ethanol level [AA]  1443 Patient placed in psych hold [AA]    Clinical Course User Index [AA] Hildegard Loge, PA-C                                 Medical Decision Making Amount and/or Complexity of Data Reviewed Labs: ordered. Radiology: ordered.   54 year old male presents for concern of suicidal ideation.  Has history of COPD and is on chronic supplemental oxygen. Chest x-ray without acute cardiopulmonary process.  He is on his baseline supplemental oxygen.  No wheezing on exam.  No evidence of COPD exacerbation. Alcohol level of 138.  CBC without leukocytosis or anemia.  CMP  without acute concern with exception of mildly elevated LFTs.  Not obstructive pattern.  Magnesium  2.0.  Salicylate, acetaminophen  levels within normal.  EKG without acute ischemic change.  Patient medically cleared for psychiatric evaluation.  Final diagnoses:  None    ED Discharge Orders     None          Hildegard Loge, PA-C 11/13/24 1324    Hildegard Loge, PA-C 11/13/24 1324    Yolande Lamar BROCKS, MD 11/13/24 1825  "

## 2024-11-14 LAB — SARS CORONAVIRUS 2 BY RT PCR: SARS Coronavirus 2 by RT PCR: NEGATIVE

## 2024-11-14 MED ORDER — HALOPERIDOL 1 MG PO TABS: 2.0000 mg | ORAL_TABLET | Freq: Once | ORAL | Status: DC

## 2024-11-14 MED ORDER — HALOPERIDOL 5 MG PO TABS
5.0000 mg | ORAL_TABLET | Freq: Once | ORAL | Status: AC
  Administered 2024-11-14: 5 mg via ORAL
  Filled 2024-11-14: qty 1

## 2024-11-14 MED ORDER — ALBUTEROL SULFATE HFA 108 (90 BASE) MCG/ACT IN AERS
2.0000 | INHALATION_SPRAY | RESPIRATORY_TRACT | Status: DC | PRN
  Administered 2024-11-14: 2 via RESPIRATORY_TRACT
  Filled 2024-11-14: qty 6.7

## 2024-11-14 NOTE — ED Notes (Signed)
 Voicemail left to [facility] for transport in the AM

## 2024-11-14 NOTE — ED Notes (Signed)
 [facility] called for this pt.

## 2024-11-14 NOTE — ED Notes (Signed)
 Report called to [employer] RN @ [facility].  Report number [phone]  Room Assignment 629  Accepting Provider Dalton Endo, NP

## 2024-11-14 NOTE — Progress Notes (Signed)
 Psychiatric Nurse Liaison Rounding Note  Current SI/HI/AVH: Denies SI/HI/AVH  Patient Mood/Affect: Patient presents with flat affect.  Noted Patient Behaviors: Patient calm and cooperative. Patient states he is ready for impending discharge to [facility].  Interventions Initiated by Psychiatric Nurse Liaison: Emotional Support  Recommendations for Patient Care: Patient being discharged to [facility].  Patients Response to Treatment: Effective   Time Spent with Patient:  5 mins

## 2024-11-14 NOTE — ED Notes (Signed)
 accepted to [facility] geri psych report number is [phone]

## 2024-11-14 NOTE — ED Provider Notes (Signed)
 Emergency Medicine Observation Re-evaluation Note  Lawrence Hanna is a 54 y.o. male, seen on rounds today.  Pt initially presented to the ED for complaints of SI Currently, the patient does not have acute complaints  Physical Exam  BP (!) 127/92   Pulse 88   Temp 98.3 F (36.8 C) (Oral)   Resp 19   Ht 5' 7 (1.702 m)   Wt 64 kg   SpO2 98%   BMI 22.10 kg/m  Physical Exam General: Resting comfortably in stretcher Lungs: Normal work of breathing Psych: Calm  ED Course / MDM  EKG:EKG Interpretation Date/Time:  Monday November 13 2024 09:42:21 EST Ventricular Rate:  77 PR Interval:  168 QRS Duration:  96 QT Interval:  392 QTC Calculation: 443 R Axis:   65  Text Interpretation: Normal sinus rhythm Incomplete right bundle branch block Borderline ECG When compared with ECG of 23-Mar-2017 09:11, PREVIOUS ECG IS PRESENT Confirmed by Theadore Sharper [phone]) on 11/14/2024 4:46:50 AM  I have reviewed the labs performed to date as well as medications administered while in observation.  Recent changes in the last 24 hours include seen by psychiatry recommends inpatient treatment.  Patient accepted to [facility]  Current plan is for transfer.    Yolande Lamar BROCKS, MD 11/14/24 [phone]

## 2024-11-14 NOTE — ED Notes (Signed)
 EDP at [facility]

## 2024-11-14 NOTE — Progress Notes (Signed)
 [facility] considering for psych inpatient admission. Per Rose, negative covid result needed to complete review prior to extending offer.

## 2024-11-14 NOTE — Progress Notes (Signed)
 Psychiatric Nurse Liaison Rounding Note  Current SI/HI/AVH: endorses SI with plan to take an overdose of fentanyl ; pt had intent; came to ED for help  Patient Mood/Affect: depressed   Noted Patient Behaviors: calm cooperative at present  Interventions Initiated by Psychiatric Nurse Liaison: none  Recommendations for Patient Care: pt asking for nebulizer treatment; pt has COPD; communicated this to pt's RN   Time Spent with Patient:  15 minutes  Loetta Pinal RN, BSN, RN-BC
